# Patient Record
Sex: Male | Born: 1960
Health system: Southern US, Community
[De-identification: ages and names within clinical notes are randomized; demographics above are authoritative.]

## PROBLEM LIST (undated history)

## (undated) DIAGNOSIS — B359 Dermatophytosis, unspecified: Secondary | ICD-10-CM

## (undated) HISTORY — DX: Dermatophytosis, unspecified: B35.9

## (undated) HISTORY — PX: HERNIA REPAIR: SHX51

---

## 2011-11-02 ENCOUNTER — Other Ambulatory Visit: Payer: Self-pay | Admitting: Unknown Physician Specialty

## 2011-11-02 DIAGNOSIS — N5089 Other specified disorders of the male genital organs: Secondary | ICD-10-CM

## 2011-11-04 ENCOUNTER — Ambulatory Visit
Admission: RE | Admit: 2011-11-04 | Discharge: 2011-11-04 | Disposition: A | Discharge status: Home / Self Care | Payer: BC Managed Care – PPO | Source: Ambulatory Visit | Attending: Unknown Physician Specialty | Admitting: Unknown Physician Specialty

## 2011-11-04 DIAGNOSIS — N5089 Other specified disorders of the male genital organs: Secondary | ICD-10-CM

## 2012-11-03 IMAGING — US US SCROTUM
1 series · 13 of 25 positions shown · non-contrast
Comparison: None.

CLINICAL DATA: Palpable mass right testicle.

ULTRASOUND OF SCROTUM
TECHNIQUE: Complete ultrasound examination of the testicles,
epididymis, and other scrotal structures was performed.

[Series 1: us scrotum · 0.07mm/px · 74 acquisitions, 13 frames shown]
[im 1/74]
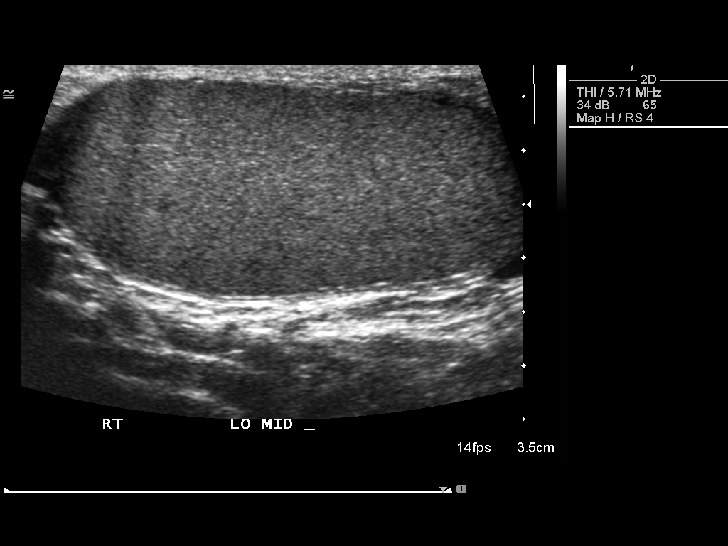
[im 7/74]
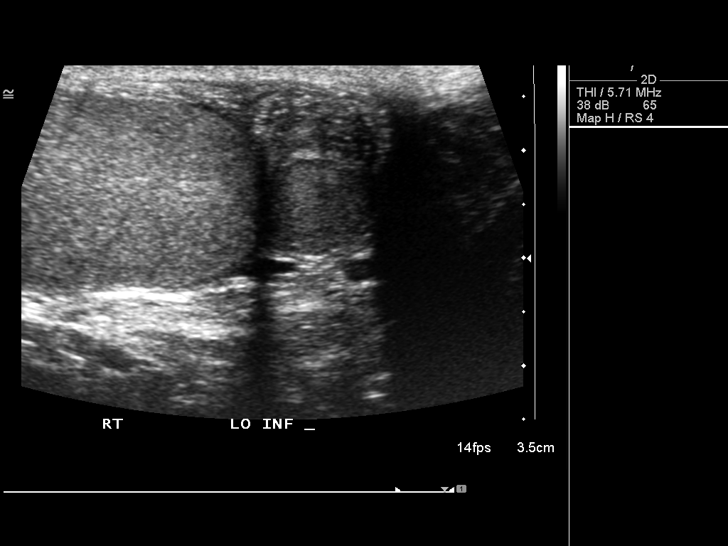
[im 13/74]
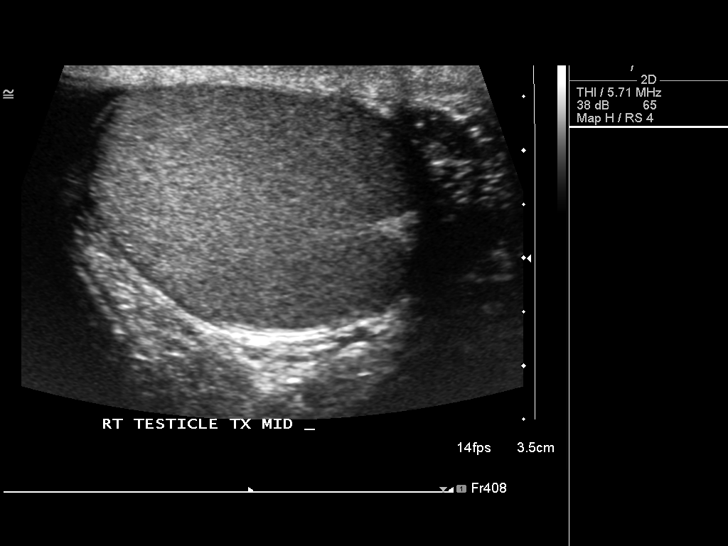
[im 19/74]
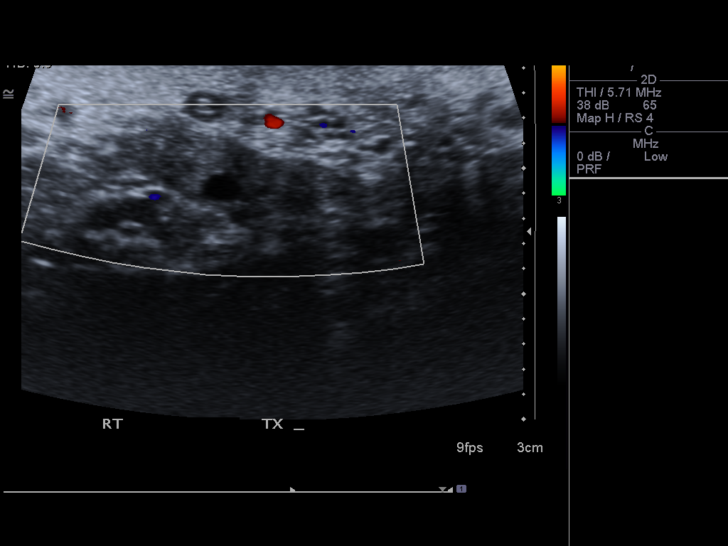
[im 25/74]
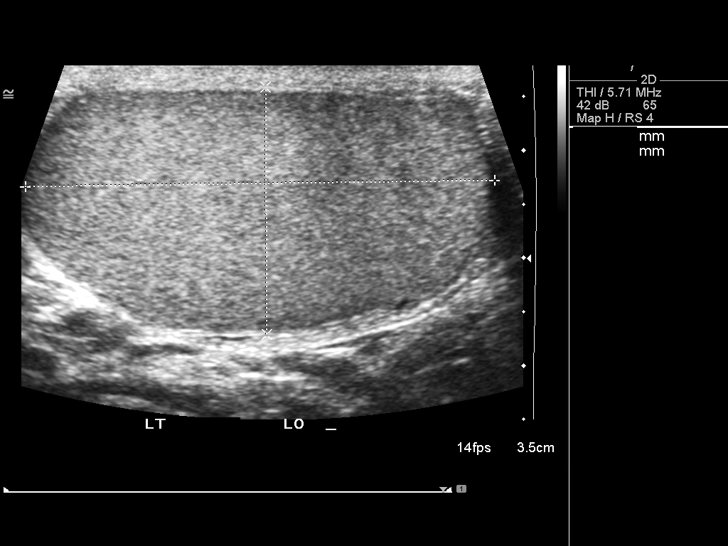
[im 31/74]
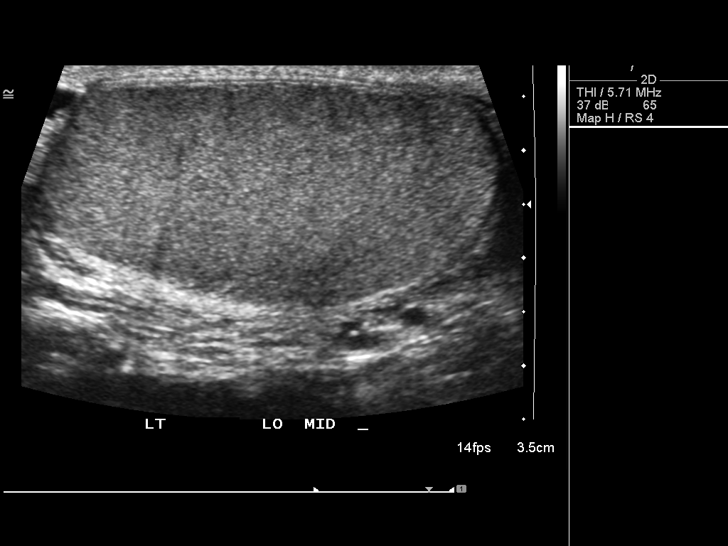
[im 37/74]
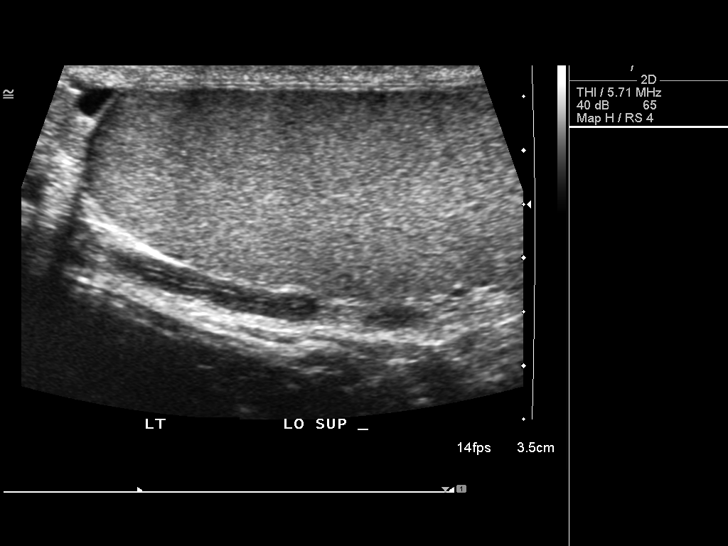
[im 43/74]
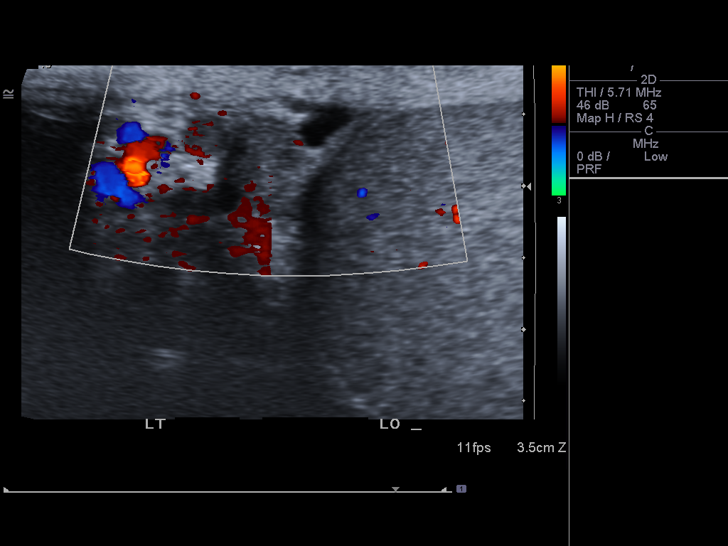
[im 49/74]
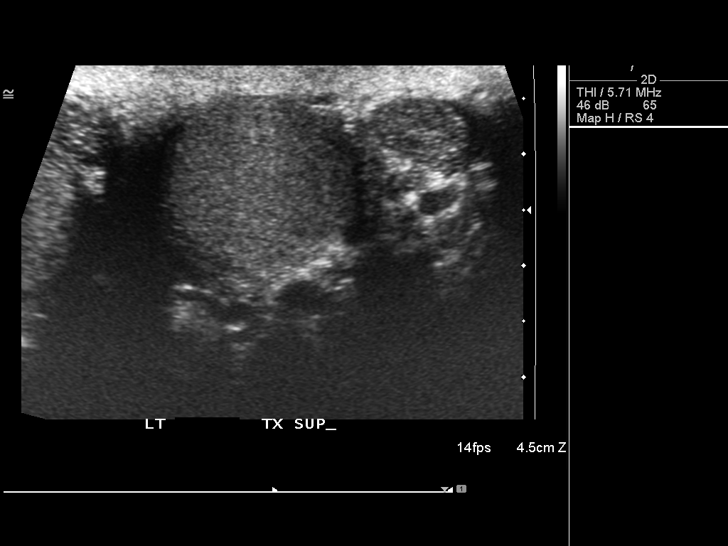
[im 55/74]
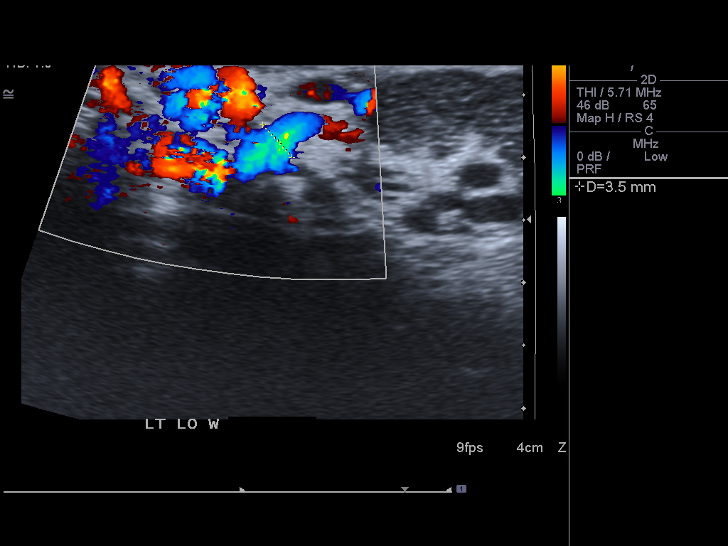
[im 61/74]
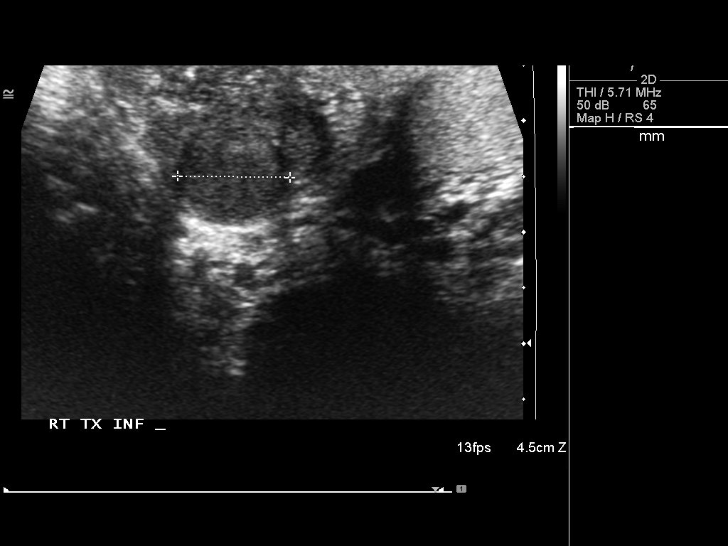
[im 67/74]
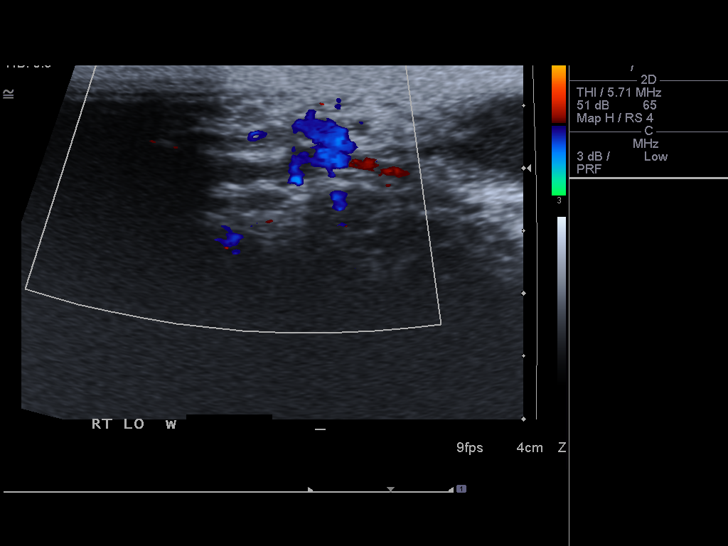
[im 74/74]
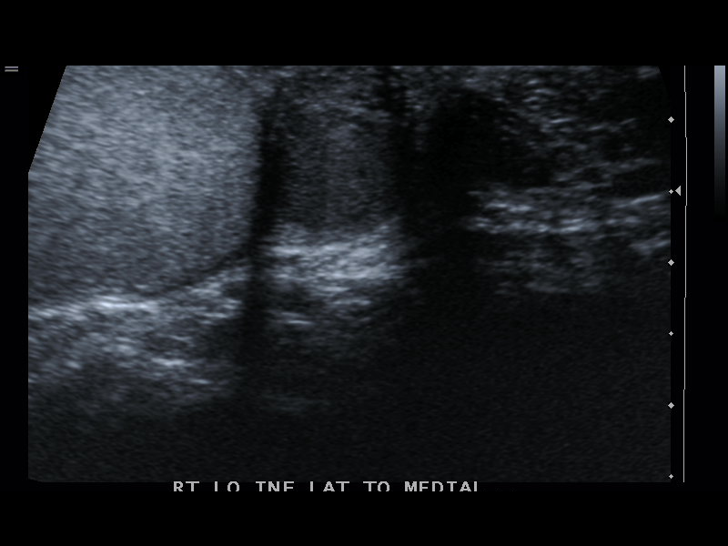

[13 of 25 positions shown; findings below may reference images not displayed]

FINDINGS: Right testis:  4.4 x 2.0 x 2.8 cm. Normal echotexture.  No focal
intratesticular abnormality.  Normal color Doppler flow.

Left testis:  4.4 x 2.3 x 3.1 cm.  Normal echotexture.  No focal
abnormality.  Normal color Doppler flow.

Right epididymis:  Small 4 mm cyst in the epididymal head.

Left epididymis:  Small 5 mm cyst in the epididymal head.

Hydrocele:  Absent.

Varicocele:  Small, left.

Inferior to the right testicle and adjacent to the epididymal tail
is a solid 1 cm hypoechoic nodule.  This is rounded and well-
circumscribed.  The nodule appears benign.  This may represent a
sperm granuloma if the patient has had prior vasectomy.
IMPRESSION: 1 cm well circumscribed rounded solid nodule inferior to the right
testicle (extratesticular) immediately adjacent to the epididymal
tail.  I favor this represents a sperm granuloma.  This can be
followed clinically and with repeat ultrasound in 4-6 months to
assure stability if felt clinically indicated.

No intratesticular abnormality.

Small bilateral epididymal cysts.

Small left varicocele.

## 2016-03-31 DIAGNOSIS — Z0000 Encounter for general adult medical examination without abnormal findings: Secondary | ICD-10-CM | POA: Diagnosis not present

## 2016-03-31 DIAGNOSIS — E7800 Pure hypercholesterolemia, unspecified: Secondary | ICD-10-CM | POA: Diagnosis not present

## 2016-07-13 DIAGNOSIS — J9801 Acute bronchospasm: Secondary | ICD-10-CM | POA: Diagnosis not present

## 2016-07-13 DIAGNOSIS — J069 Acute upper respiratory infection, unspecified: Secondary | ICD-10-CM | POA: Diagnosis not present

## 2016-08-05 DIAGNOSIS — Z1211 Encounter for screening for malignant neoplasm of colon: Secondary | ICD-10-CM | POA: Diagnosis not present

## 2016-08-05 DIAGNOSIS — D120 Benign neoplasm of cecum: Secondary | ICD-10-CM | POA: Diagnosis not present

## 2020-06-04 ENCOUNTER — Other Ambulatory Visit (HOSPITAL_BASED_OUTPATIENT_CLINIC_OR_DEPARTMENT_OTHER): Payer: Self-pay | Admitting: Internal Medicine

## 2020-06-04 ENCOUNTER — Ambulatory Visit: Payer: Self-pay | Attending: Internal Medicine

## 2020-06-04 DIAGNOSIS — Z23 Encounter for immunization: Secondary | ICD-10-CM

## 2020-06-04 NOTE — Progress Notes (Signed)
   Covid-19 Vaccination Clinic  Name:  Stephen Chen    MRN: [id number] DOB: 01-15-1961  06/04/2020  [name] was observed post Covid-19 immunization for 15 minutes without incident. He was provided with Vaccine Information Sheet and instruction to access the V-Safe system.   [name] was instructed to call 911 with any severe reactions post vaccine: Marland Kitchen Difficulty breathing  . Swelling of face and throat  . A fast heartbeat  . A bad rash all over body  . Dizziness and weakness   Immunizations Administered    Name Date Dose VIS Date Route   Pfizer COVID-19 Vaccine 06/04/2020 10:00 AM 0.3 mL 04/17/2020 Intramuscular   Manufacturer: [employer], [employer]   Lot: [id number]   NDC: [address]-1

## 2020-06-11 MED FILL — PFIZER-BIONTECH COVID-19 VA: 30 | 1 days supply | Qty: 0 | Fill #0

## 2021-01-14 ENCOUNTER — Ambulatory Visit: Payer: Self-pay | Attending: Internal Medicine

## 2021-01-14 DIAGNOSIS — Z23 Encounter for immunization: Secondary | ICD-10-CM

## 2021-01-14 NOTE — Progress Notes (Signed)
   Covid-19 Vaccination Clinic  Name:  Azavion Bouillon    MRN: [id number] DOB: June 16, 1961  01/14/2021  Mr. Townley was observed post Covid-19 immunization for 15 minutes without incident. He was provided with Vaccine Information Sheet and instruction to access the V-Safe system.   Mr. Delman was instructed to call 911 with any severe reactions post vaccine: Difficulty breathing  Swelling of face and throat  A fast heartbeat  A bad rash all over body  Dizziness and weakness   Immunizations Administered     Name Date Dose VIS Date Route   PFIZER Comrnaty(Gray TOP) Covid-19 Vaccine 01/14/2021 10:01 AM 0.3 mL 06/06/2020 Intramuscular   Manufacturer: [employer], [employer]   Lot: [id number]   NDC: [phone]

## 2021-01-20 ENCOUNTER — Other Ambulatory Visit (HOSPITAL_BASED_OUTPATIENT_CLINIC_OR_DEPARTMENT_OTHER): Payer: Self-pay

## 2021-01-20 MED ORDER — COVID-19 MRNA VAC-TRIS(PFIZER) 30 MCG/0.3ML IM SUSP
INTRAMUSCULAR | 0 refills | Status: DC
  Filled 2021-01-20: qty 0.3, 1d supply, fill #0

## 2021-09-18 ENCOUNTER — Telehealth: Payer: Self-pay | Admitting: Emergency Medicine

## 2021-09-18 DIAGNOSIS — R21 Rash and other nonspecific skin eruption: Secondary | ICD-10-CM

## 2021-09-18 MED ORDER — PREDNISONE 20 MG PO TABS: ORAL_TABLET | ORAL | 0 refills | Status: DC

## 2021-09-18 NOTE — Patient Instructions (Signed)
This seems more consistent with a contact dermatitis.  Let's treat it with steroids.  If you're not improving after a few days of the steroids, you should be seen in person. ? ?If your symptoms change, worsen, or you run a fever, you should be seen in person. ?

## 2021-09-18 NOTE — Progress Notes (Signed)
?Virtual Visit Consent  ? ?Stephen Chen, you are scheduled for a virtual visit with a [facility] provider today.   ?  ?Just as with appointments in the office, your consent must be obtained to participate.  Your consent will be active for this visit and any virtual visit you may have with one of our providers in the next 365 days.   ?  ?If you have a MyChart account, a copy of this consent can be sent to you electronically.  All virtual visits are billed to your insurance company just like a traditional visit in the office.   ? ?As this is a virtual visit, video technology does not allow for your provider to perform a traditional examination.  This may limit your provider's ability to fully assess your condition.  If your provider identifies any concerns that need to be evaluated in person or the need to arrange testing (such as labs, EKG, etc.), we will make arrangements to do so.   ?  ?Although advances in technology are sophisticated, we cannot ensure that it will always work on either your end or our end.  If the connection with a video visit is poor, the visit may have to be switched to a telephone visit.  With either a video or telephone visit, we are not always able to ensure that we have a secure connection.    ? ?I need to obtain your verbal consent now.   Are you willing to proceed with your visit today?  ?  ?[name] has provided verbal consent on 09/18/2021 for a virtual visit (video or telephone). ?  ?Roxy Horseman, PA-C  ? ?Date: 09/18/2021 11:17 AM ? ? ?Virtual Visit via Video Note  ? Stephen Chen, connected with  Stephen Chen  ([id number], 1960/10/10) on 09/18/21 at 11:15 AM EDT by a video-enabled telemedicine application and verified that I am speaking with the correct person using two identifiers. ? ?Location: ?Patient: Virtual Visit Location Patient: Home ?Provider: Virtual Visit Location Provider: Home Office ?  ?I discussed the limitations of evaluation and management by telemedicine and  the availability of in person appointments. The patient expressed understanding and agreed to proceed.   ? ?History of Present Illness: ?Stephen Chen is a 61 y.o. who identifies as a male who was assigned male at birth, and is being seen today for rash.  States that he thinks he might have shingles or poison ivy.  States that he has been in some brush outside.  Reports having had COVID a few months ago.  States that he had chicken pox as a kid.  Denies feeling sick.  Denies other medical problems.  States that he has had the rash for a few weeks now. ? ?HPI: HPI  ?Problems: There are no problems to display for this patient. ?  ?Allergies: Not on File ?Medications:  ?Current Outpatient Medications:  ?  COVID-19 mRNA Vac-TriS, Pfizer, SUSP injection, Inject into the muscle., Disp: 0.3 mL, Rfl: 0 ? ?Observations/Objective: ?Patient is well-developed, well-nourished in no acute distress.  ?Resting comfortably at home.  ?Head is normocephalic, atraumatic.  ?No labored breathing.  ?Speech is clear and coherent with logical content.  ?Patient is alert and oriented at baseline.  ?Scattered vesicular lesions on arms and legs ? ?Assessment and Plan: ?1. Rash ? ?Prednisone for presumed contact dermatitis.  Discussed with patient that shingles is unlikely given bilateral presentation in healthy male and length of illness. ? ?Follow Up Instructions: ?I discussed the assessment and  treatment plan with the patient. The patient was provided an opportunity to ask questions and all were answered. The patient agreed with the plan and demonstrated an understanding of the instructions.  A copy of instructions were sent to the patient via MyChart unless otherwise noted below.  ? ? ?The patient was advised to call back or seek an in-person evaluation if the symptoms worsen or if the condition fails to improve as anticipated. ? ?Time:  ?I spent 12 minutes with the patient via telehealth technology discussing the above problems/concerns.    ? ?Roxy Horseman, PA-C ? ?

## 2022-05-18 ENCOUNTER — Telehealth: Payer: Self-pay | Admitting: Physician Assistant

## 2022-05-18 DIAGNOSIS — L2084 Intrinsic (allergic) eczema: Secondary | ICD-10-CM

## 2022-05-18 MED ORDER — TRIAMCINOLONE ACETONIDE 0.1 % EX CREA: 1.0000 | TOPICAL_CREAM | Freq: Two times a day (BID) | CUTANEOUS | 0 refills | Status: DC

## 2022-05-18 MED ORDER — PREDNISONE 20 MG PO TABS: ORAL_TABLET | ORAL | 0 refills | Status: DC

## 2022-05-18 NOTE — Progress Notes (Signed)
Virtual Visit Consent   Stephen Chen, you are scheduled for a virtual visit with a [facility] provider today. Just as with appointments in the office, your consent must be obtained to participate. Your consent will be active for this visit and any virtual visit you may have with one of our providers in the next 365 days. If you have a MyChart account, a copy of this consent can be sent to you electronically.  As this is a virtual visit, video technology does not allow for your provider to perform a traditional examination. This may limit your provider's ability to fully assess your condition. If your provider identifies any concerns that need to be evaluated in person or the need to arrange testing (such as labs, EKG, etc.), we will make arrangements to do so. Although advances in technology are sophisticated, we cannot ensure that it will always work on either your end or our end. If the connection with a video visit is poor, the visit may have to be switched to a telephone visit. With either a video or telephone visit, we are not always able to ensure that we have a secure connection.  By engaging in this virtual visit, you consent to the provision of healthcare and authorize for your insurance to be billed (if applicable) for the services provided during this visit. Depending on your insurance coverage, you may receive a charge related to this service.  I need to obtain your verbal consent now. Are you willing to proceed with your visit today? Aime Basnett has provided verbal consent on 05/18/2022 for a virtual visit (video or telephone). [name], PA-C  Date: 05/18/2022 9:18 AM  Virtual Visit via Video Note   I, [name], connected with  Stephen Chen  ([id number], 03-10-1961) on 05/18/22 at  9:00 AM EST by a video-enabled telemedicine application and verified that I am speaking with the correct person using two identifiers.  Location: Patient: Virtual Visit Location Patient:  Home Provider: Virtual Visit Location Provider: Home Office   I discussed the limitations of evaluation and management by telemedicine and the availability of in person appointments. The patient expressed understanding and agreed to proceed.    History of Present Illness: Stephen Chen is a 61 y.o. who identifies as a male who was assigned male at birth, and is being seen today for rash.  HPI: Rash This is a new problem. The current episode started more than 1 month ago (had a flare in March and recent). The problem has been gradually worsening since onset. The affected locations include the torso, left arm, right arm and left lower leg. The rash is characterized by itchiness, scaling and redness. He was exposed to nothing. Pertinent negatives include no congestion, cough, fatigue, rhinorrhea or shortness of breath. Past treatments include oral steroids. The treatment provided no relief.     Problems: There are no problems to display for this patient.   Allergies: Not on File Medications:  Current Outpatient Medications:    triamcinolone cream (KENALOG) 0.1 %, Apply 1 Application topically 2 (two) times daily., Disp: 80 g, Rfl: 0   COVID-19 mRNA Vac-TriS, Pfizer, SUSP injection, Inject into the muscle., Disp: 0.3 mL, Rfl: 0   predniSONE (DELTASONE) 20 MG tablet, Take 40 mg by mouth daily for 5 days, then 20mg  by mouth daily for 5 days, then 10mg  daily for 5 days, Disp: 18 tablet, Rfl: 0  Observations/Objective: Patient is well-developed, well-nourished in no acute distress.  Resting comfortably at home.  Head is normocephalic, atraumatic.  No labored breathing.  Speech is clear and coherent with logical content.  Patient is alert and oriented at baseline.  Erythematous patches with well demarcated, irregular borders that began with some vesicles and papules progressing to flat patch with scaling that is pruritic  Assessment and Plan: 1. Intrinsic eczema - predniSONE (DELTASONE) 20 MG  tablet; Take 40 mg by mouth daily for 5 days, then 20mg  by mouth daily for 5 days, then 10mg  daily for 5 days  Dispense: 18 tablet; Refill: 0 - triamcinolone cream (KENALOG) 0.1 %; Apply 1 Application topically 2 (two) times daily.  Dispense: 80 g; Refill: 0  - Suspect eczema based off lesions visualized but advised with video may not be fully able to evaluate clearly - Second occurrence, last was March - Will treat with prednisone since been present over a month again - Triamcinolone for new flares in the future - Luke warm to cool showers to avoid activating itch reflex - Moisturize well (Lubriderm, Eucerin, CeraVe, Aquaphor) - Seek in person evaluation if symptoms continue to worsen or fail to improve with treatment  Follow Up Instructions: I discussed the assessment and treatment plan with the patient. The patient was provided an opportunity to ask questions and all were answered. The patient agreed with the plan and demonstrated an understanding of the instructions.  A copy of instructions were sent to the patient via MyChart unless otherwise noted below.    The patient was advised to call back or seek an in-person evaluation if the symptoms worsen or if the condition fails to improve as anticipated.  Time:  I spent 15 minutes with the patient via telehealth technology discussing the above problems/concerns.    , PA-C

## 2022-05-18 NOTE — Patient Instructions (Signed)
Stephen Chen, thank you for joining [name], PA-C for today's virtual visit.  While this provider is not your primary care provider (PCP), if your PCP is located in our provider database this encounter information will be shared with them immediately following your visit.   A [facility] MyChart account gives you access to today's visit and all your visits, tests, and labs performed at [facility] " click here if you don't have a [facility] MyChart account or go to [email or web address]  Consent: (Patient) Stephen Chen provided verbal consent for this virtual visit at the beginning of the encounter.  Current Medications:  Current Outpatient Medications:    triamcinolone cream (KENALOG) 0.1 %, Apply 1 Application topically 2 (two) times daily., Disp: 80 g, Rfl: 0   COVID-19 mRNA Vac-TriS, Pfizer, SUSP injection, Inject into the muscle., Disp: 0.3 mL, Rfl: 0   predniSONE (DELTASONE) 20 MG tablet, Take 40 mg by mouth daily for 5 days, then 20mg  by mouth daily for 5 days, then 10mg  daily for 5 days, Disp: 18 tablet, Rfl: 0   Medications ordered in this encounter:  Meds ordered this encounter  Medications   predniSONE (DELTASONE) 20 MG tablet    Sig: Take 40 mg by mouth daily for 5 days, then 20mg  by mouth daily for 5 days, then 10mg  daily for 5 days    Dispense:  18 tablet    Refill:  0    Order Specific Question:   Supervising Provider    Answer:      triamcinolone cream (KENALOG) 0.1 %    Sig: Apply 1 Application topically 2 (two) times daily.    Dispense:  80 g    Refill:  0    Order Specific Question:   Supervising Provider    Answer:        *If you need refills on other medications prior to your next appointment, please contact your pharmacy*  Follow-Up: Call back or seek an in-person evaluation if the symptoms worsen or if the condition fails to improve as anticipated.  [facility]  [phone]  Other Instructions  Eczema Eczema refers to a group of skin conditions that cause skin to become rough and inflamed. Each type of eczema has different triggers, symptoms, and treatments. Eczema of any type is usually itchy. Symptoms range from mild to severe. Eczema is not spread from person to person (is not contagious). It can appear on different parts of the body at different times. One person's eczema may look different from another person's eczema. What are the causes? The exact cause of this condition is not known. However, exposure to certain environmental factors, irritants, and allergens can make the condition worse. What are the signs or symptoms? Symptoms of this condition depend on the type of eczema you have. The types include: Contact dermatitis. There are two kinds: Irritant contact dermatitis. This happens when something irritates the skin and causes a rash. Allergic contact dermatitis. This happens when your skin comes in contact with something you are allergic to (allergens). This can include poison ivy, chemicals, or medicines that were applied to your skin. Atopic dermatitis. This is a long-term (chronic) skin disease that keeps coming back (recurring). It is the most common type of eczema. Usual symptoms are a red rash and itchy, dry, scaly skin. It usually starts showing signs in infancy and can last through adulthood. Dyshidrotic eczema. This is a form of eczema on the hands  and feet. It shows up as very itchy, fluid-filled blisters. It can affect people of any age but is more common before age 43. Hand eczema. This causes very itchy areas of skin on the palms and sides of the hands and fingers. This type of eczema is common in industrial jobs where you may be exposed to different types of irritants. Lichen simplex chronicus. This type of eczema occurs when a person constantly scratches one area of the body. Repeated scratching of the area leads to thickened  skin (lichenification). This condition can accompany other types of eczema. It is more common in adults but may also be seen in children. Nummular eczema. This is a common type of eczema that most often affects the lower legs and the backs of the hands. It typically causes an itchy, red, circular, crusty lesion (plaque). Scratching may become a habit and can cause bleeding. Nummular eczema occurs most often in middle-aged or older people. Seborrheic dermatitis. This is a common skin disease that mainly affects the scalp. It may also affect other oily areas of the body, such as the face, sides of the nose, eyebrows, ears, eyelids, and chest. It is marked by small scaling and redness of the skin (erythema). This can affect people of all ages. In infants, this condition is called cradle cap. Stasis dermatitis. This is a common skin disease that can cause itching, scaling, and hyperpigmentation, usually on the legs and feet. It occurs most often in people who have a condition that prevents blood from being pumped through the veins in the legs (chronic venous insufficiency). Stasis dermatitis is a chronic condition that needs long-term management. How is this diagnosed? This condition may be diagnosed based on: A physical exam of your skin. Your medical history. Skin patch tests. These tests involve using patches that contain possible allergens and placing them on your back. Your health care provider will check in a few days to see if an allergic reaction occurred. How is this treated? Treatment for eczema is based on the type of eczema you have. You may be given hydrocortisone steroid medicine or antihistamines. These can relieve itching quickly and help reduce inflammation. These may be prescribed or purchased over the counter, depending on the strength that is needed. Follow these instructions at home: Take or apply over-the-counter and prescription medicines only as told by your health care provider. Use  creams or ointments to moisturize your skin. Do not use lotions. Learn what triggers or irritates your symptoms so you can avoid these things. Treat symptom flare-ups quickly. Do not scratch your skin. This can make your rash worse. Keep all follow-up visits. This is important. Where to find more information American Academy of Dermatology: MarketingSheets.si National Eczema Association: nationaleczema.org The Society for Pediatric Dermatology: pedsderm.net Contact a health care provider if: You have severe itching, even with treatment. You scratch your skin regularly until it bleeds. Your rash looks different than usual. Your skin is painful, swollen, or more red than usual. You have a fever. Summary Eczema refers to a group of skin conditions that cause skin to become rough and inflamed. Each type has different triggers. Eczema of any type causes itching that may range from mild to severe. Treatment varies based on the type of eczema you have. Hydrocortisone steroid medicine or antihistamines can help with itching and inflammation. Protecting your skin is the best way to prevent eczema. Use creams or ointments to moisturize your skin. Avoid triggers and irritants. Treat flare-ups quickly. This information is  not intended to replace advice given to you by your health care provider. Make sure you discuss any questions you have with your health care provider. Document Revised: 03/25/2020 Document Reviewed: 03/25/2020 Elsevier Patient Education  2023 Elsevier Inc.    If you have been instructed to have an in-person evaluation today at a local Urgent Care facility, please use the link below. It will take you to a list of all of our available [facility], including address, phone number and hours of operation. Please do not delay care.  [facility]  If you or a family member do not have a primary care provider, use the link below to schedule a visit and establish care. When  you choose a [facility] primary care physician or advanced practice provider, you gain a long-term partner in health. Find a Primary Care Provider  Learn more about [facility]'s in-office and virtual care options: [facility] - Get Care Now

## 2022-10-13 ENCOUNTER — Telehealth: Payer: Self-pay | Admitting: Family Medicine

## 2022-10-13 DIAGNOSIS — B354 Tinea corporis: Secondary | ICD-10-CM

## 2022-10-13 MED ORDER — FLUCONAZOLE 150 MG PO TABS: 150.0000 mg | ORAL_TABLET | ORAL | 0 refills | Status: AC

## 2022-10-13 MED ORDER — TRIAMCINOLONE ACETONIDE 0.1 % EX CREA
1.0000 | TOPICAL_CREAM | Freq: Two times a day (BID) | CUTANEOUS | 0 refills | Status: DC
Start: 2022-10-13 — End: 2023-05-05

## 2022-10-13 NOTE — Progress Notes (Unsigned)
Virtual Visit Consent   Stephen Chen, you are scheduled for a virtual visit with a [facility] provider today. Just as with appointments in the office, your consent must be obtained to participate. Your consent will be active for this visit and any virtual visit you may have with one of our providers in the next 365 days. If you have a MyChart account, a copy of this consent can be sent to you electronically.  As this is a virtual visit, video technology does not allow for your provider to perform a traditional examination. This may limit your provider's ability to fully assess your condition. If your provider identifies any concerns that need to be evaluated in person or the need to arrange testing (such as labs, EKG, etc.), we will make arrangements to do so. Although advances in technology are sophisticated, we cannot ensure that it will always work on either your end or our end. If the connection with a video visit is poor, the visit may have to be switched to a telephone visit. With either a video or telephone visit, we are not always able to ensure that we have a secure connection.  By engaging in this virtual visit, you consent to the provision of healthcare and authorize for your insurance to be billed (if applicable) for the services provided during this visit. Depending on your insurance coverage, you may receive a charge related to this service.  I need to obtain your verbal consent now. Are you willing to proceed with your visit today? [name] has provided verbal consent on 10/13/2022 for a virtual visit (video or telephone). Freddy Finner, NP  Date: 10/13/2022 10:02 AM  Virtual Visit via Video Note   I, Freddy Finner, connected with  [name]  ([id number], [birth year]) on 10/13/22 at 10:00 AM EDT by a video-enabled telemedicine application and verified that I am speaking with the correct person using two identifiers.  Location: Patient: Virtual Visit Location Patient: Home Provider:  Virtual Visit Location Provider: Home Office   I discussed the limitations of evaluation and management by telemedicine and the availability of in person appointments. The patient expressed understanding and agreed to proceed.    History of Present Illness: Stephen Chen is a 62 y.o. who identifies as a male who was assigned male at birth, and is being seen today for rash.  Recurrent rash/ eczema Dx in 04/2022- see notes in EPIC Never had this prior to last Fall when had this occur  Flare up now- affected locations include the left elbow, right elbow, top of shoulders, back and shins. Itching and red, scaling- and circle   Has tried antifungal cream without much success. Tried for a month- it helped the itching. Seemed to improve, but worsen again. Does not free prednisone was as much of a benefit when he took it last Fall.   Problems: There are no problems to display for this patient.   Allergies: Not on File Medications:  Current Outpatient Medications:    COVID-19 mRNA Vac-TriS, Pfizer, SUSP injection, Inject into the muscle., Disp: 0.3 mL, Rfl: 0   predniSONE (DELTASONE) 20 MG tablet, Take 40 mg by mouth daily for 5 days, then  by mouth daily for 5 days, then  daily for 5 days, Disp: 18 tablet, Rfl: 0   triamcinolone cream (KENALOG) 0.1 %, Apply 1 Application topically 2 (two) times daily., Disp: 80 g, Rfl: 0  Observations/Objective:  No labored breathing.  Speech is clear and coherent with logical content.  Patient is alert and oriented at baseline.    Assessment and Plan:   1. Tinea corporis  - fluconazole (DIFLUCAN) 150 MG tablet; Take 1 tablet (150 mg total) by mouth once a week for 4 doses.  Dispense: 4 tablet; Refill: 0 - triamcinolone cream (KENALOG) 0.1 %; Apply 1 Application topically 2 (two) times daily.  Dispense: 80 g; Refill: 0  -based off conversation and review of notes - will try fungal oral treatment this time around.  -complete 4 weeks; follow up  if not improved -use cream for immediate relief of itching and spread    Reviewed side effects, risks and benefits of medication.    Patient acknowledged agreement and understanding of the plan.   Past Medical, Surgical, Social History, Allergies, and Medications have been Reviewed.    Follow Up Instructions: I discussed the assessment and treatment plan with the patient. The patient was provided an opportunity to ask questions and all were answered. The patient agreed with the plan and demonstrated an understanding of the instructions.  A copy of instructions were sent to the patient via MyChart unless otherwise noted below.    The patient was advised to call back or seek an in-person evaluation if the symptoms worsen or if the condition fails to improve as anticipated.  Time:  I spent 10 minutes with the patient via telehealth technology discussing the above problems/concerns.    Freddy Finner, NP

## 2022-10-13 NOTE — Patient Instructions (Addendum)
Stephen Chen, thank you for joining Freddy Finner, NP for today's virtual visit.  While this provider is not your primary care provider (PCP), if your PCP is located in our provider database this encounter information will be shared with them immediately following your visit.   A [facility] MyChart account gives you access to today's visit and all your visits, tests, and labs performed at [facility] " click here if you don't have a [facility] MyChart account or go to [email or web address]  Consent: (Patient) Stephen Chen provided verbal consent for this virtual visit at the beginning of the encounter.  Current Medications:  Current Outpatient Medications:    fluconazole (DIFLUCAN) 150 MG tablet, Take 1 tablet (150 mg total) by mouth once a week for 4 doses., Disp: 4 tablet, Rfl: 0   COVID-19 mRNA Vac-TriS, Pfizer, SUSP injection, Inject into the muscle., Disp: 0.3 mL, Rfl: 0   predniSONE (DELTASONE) 20 MG tablet, Take 40 mg by mouth daily for 5 days, then  by mouth daily for 5 days, then  daily for 5 days, Disp: 18 tablet, Rfl: 0   triamcinolone cream (KENALOG) 0.1 %, Apply 1 Application topically 2 (two) times daily., Disp: 80 g, Rfl: 0   Medications ordered in this encounter:  Meds ordered this encounter  Medications   fluconazole (DIFLUCAN) 150 MG tablet    Sig: Take 1 tablet (150 mg total) by mouth once a week for 4 doses.    Dispense:  4 tablet    Refill:  0    Order Specific Question:   Supervising Provider    Answer:   Merrilee Jansky [id number]   triamcinolone cream (KENALOG) 0.1 %    Sig: Apply 1 Application topically 2 (two) times daily.    Dispense:  80 g    Refill:  0    Order Specific Question:   Supervising Provider    Answer:   Merrilee Jansky [[id number]]     *If you need refills on other medications prior to your next appointment, please contact your pharmacy*  Follow-Up: Call back or seek an in-person evaluation if the symptoms worsen or  if the condition fails to improve as anticipated.  [facility] [phone]  Other Instructions  Body Ringworm Body ringworm is an infection of the skin that often causes a ring-shaped rash. Body ringworm is also called tinea corporis. Body ringworm can affect any part of your skin. This condition is easily spread from person to person (is very contagious). What are the causes? This condition is caused by fungi called dermatophytes. The condition develops when these fungi grow out of control on the skin. You can get this condition if you touch a person or animal that has it. You can also get it if you share any items with an infected person or pet. These include: Clothing, bedding, and towels. Brushes or combs. Gym equipment. Any other object that has the fungus on it. What increases the risk? You are more likely to develop this condition if you: Play sports that involve close physical contact, such as wrestling. Sweat a lot. Live in areas that are hot and humid. Use public showers. Have a weakened disease-fighting system (immune system). What are the signs or symptoms? Symptoms of this condition include: Itchy, raised red spots and bumps. Red scaly patches. A ring-shaped rash. The rash may have: A clear center. Scales or red bumps at its center. Redness near its borders. Dry and scaly skin on or  around it. How is this diagnosed? This condition can usually be diagnosed with a skin exam. A skin scraping may be taken from the affected area and examined under a microscope to see if the fungus is present. How is this treated? This condition may be treated with: An antifungal cream or ointment. An antifungal shampoo. Antifungal medicines. These may be prescribed if your ringworm: Is severe. Keeps coming back or lasts a long time. Follow these instructions at home: Take over-the-counter and prescription medicines only as told by your health care provider. If you  were given an antifungal cream or ointment: Use it as told by your health care provider. Wash the infected area and dry it completely before applying the cream or ointment. If you were given an antifungal shampoo: Use it as told by your health care provider. Leave the shampoo on your body for 3-5 minutes before rinsing. While you have a rash: Wear loose clothing to stop clothes from rubbing and irritating it. Wash or change your bed sheets every night. Wash clothes and bed sheets in hot water. Disinfect or throw out items that may be infected. Wash your hands often with soap and water for at least 20 seconds. If soap and water are not available, use hand sanitizer. If your pet has the same infection, take your pet to see a veterinarian for treatment. How is this prevented? Take a bath or shower every day and after every time you work out or play sports. Dry your skin completely after bathing. Wear sandals or shoes in public places and showers. Wash athletic clothes after each use. Do not share personal items with others. Avoid touching red patches of skin on other people. Avoid touching pets that have bald spots. If you touch an animal that has a bald spot, wash your hands. Contact a health care provider if: Your rash continues to spread after 7 days of treatment. Your rash is not gone in 4 weeks. The area around your rash gets red, warm, tender, and swollen. This information is not intended to replace advice given to you by your health care provider. Make sure you discuss any questions you have with your health care provider. Document Revised: 11/27/2021 Document Reviewed: 11/27/2021 Elsevier Patient Education  2023 Elsevier Inc.    If you have been instructed to have an in-person evaluation today at a local Urgent Care facility, please use the link below. It will take you to a list of all of our available [facility], including address, phone number and hours of  operation. Please do not delay care.  [facility]  If you or a family member do not have a primary care provider, use the link below to schedule a visit and establish care. When you choose a [facility] primary care physician or advanced practice provider, you gain a long-term partner in health. Find a Primary Care Provider  Learn more about [facility]'s in-office and virtual care options: [facility] - Get Care Now

## 2022-11-10 ENCOUNTER — Telehealth: Payer: Self-pay | Admitting: Family Medicine

## 2022-11-10 DIAGNOSIS — B354 Tinea corporis: Secondary | ICD-10-CM

## 2022-11-10 DIAGNOSIS — R21 Rash and other nonspecific skin eruption: Secondary | ICD-10-CM

## 2022-11-10 MED ORDER — TERBINAFINE HCL 250 MG PO TABS
250.0000 mg | ORAL_TABLET | Freq: Every day | ORAL | 0 refills | Status: AC
Start: 2022-11-10 — End: 2022-11-24

## 2022-11-10 MED ORDER — NYSTATIN-TRIAMCINOLONE 100000-0.1 UNIT/GM-% EX OINT
1.0000 | TOPICAL_OINTMENT | Freq: Two times a day (BID) | CUTANEOUS | 0 refills | Status: DC
Start: 2022-11-10 — End: 2023-08-10

## 2022-11-10 NOTE — Patient Instructions (Signed)
  Stephen Chen, thank you for joining Freddy Finner, NP for today's virtual visit.  While this provider is not your primary care provider (PCP), if your PCP is located in our provider database this encounter information will be shared with them immediately following your visit.   A [facility] MyChart account gives you access to today's visit and all your visits, tests, and labs performed at [facility] " click here if you don't have a [facility] MyChart account or go to [email or web address]  Consent: (Patient) Stephen Chen provided verbal consent for this virtual visit at the beginning of the encounter.  Current Medications:  Current Outpatient Medications:    nystatin-triamcinolone ointment (MYCOLOG), Apply 1 Application topically 2 (two) times daily., Disp: 30 g, Rfl: 0   terbinafine (LAMISIL) 250 MG tablet, Take 1 tablet (250 mg total) by mouth daily for 14 days., Disp: 14 tablet, Rfl: 0   COVID-19 mRNA Vac-TriS, Pfizer, SUSP injection, Inject into the muscle., Disp: 0.3 mL, Rfl: 0   predniSONE (DELTASONE) 20 MG tablet, Take 40 mg by mouth daily for 5 days, then 20mg  by mouth daily for 5 days, then 10mg  daily for 5 days, Disp: 18 tablet, Rfl: 0   triamcinolone cream (KENALOG) 0.1 %, Apply 1 Application topically 2 (two) times daily., Disp: 80 g, Rfl: 0   Medications ordered in this encounter:  Meds ordered this encounter  Medications   terbinafine (LAMISIL) 250 MG tablet    Sig: Take 1 tablet (250 mg total) by mouth daily for 14 days.    Dispense:  14 tablet    Refill:  0    Order Specific Question:   Supervising Provider    Answer:   Merrilee Jansky [[id number]]   nystatin-triamcinolone ointment (MYCOLOG)    Sig: Apply 1 Application topically 2 (two) times daily.    Dispense:  30 g    Refill:  0    Order Specific Question:   Supervising Provider    Answer:   Merrilee Jansky [id number]     *If you need refills on other medications prior to your next appointment,  please contact your pharmacy*  Follow-Up: Call back or seek an in-person evaluation if the symptoms worsen or if the condition fails to improve as anticipated.  [facility] [phone]  Other Instructions  Use meds as directed  Hope this clears it up!  If not- please be seen in person to see what might been to be done.   If you have been instructed to have an in-person evaluation today at a local Urgent Care facility, please use the link below. It will take you to a list of all of our available [facility], including address, phone number and hours of operation. Please do not delay care.  [facility]  If you or a family member do not have a primary care provider, use the link below to schedule a visit and establish care. When you choose a [facility] primary care physician or advanced practice provider, you gain a long-term partner in health. Find a Primary Care Provider  Learn more about [facility]'s in-office and virtual care options: [facility] - Get Care Now

## 2022-11-10 NOTE — Progress Notes (Signed)
[place]   Unable to connect camera to see rash.  Will try to be seen in person or work on getting a camera working.

## 2022-11-10 NOTE — Progress Notes (Signed)
Virtual Visit Consent   Stephen Chen, you are scheduled for a virtual visit with a [facility] provider today. Just as with appointments in the office, your consent must be obtained to participate. Your consent will be active for this visit and any virtual visit you may have with one of our providers in the next 365 days. If you have a MyChart account, a copy of this consent can be sent to you electronically.  As this is a virtual visit, video technology does not allow for your provider to perform a traditional examination. This may limit your provider's ability to fully assess your condition. If your provider identifies any concerns that need to be evaluated in person or the need to arrange testing (such as labs, EKG, etc.), we will make arrangements to do so. Although advances in technology are sophisticated, we cannot ensure that it will always work on either your end or our end. If the connection with a video visit is poor, the visit may have to be switched to a telephone visit. With either a video or telephone visit, we are not always able to ensure that we have a secure connection.  By engaging in this virtual visit, you consent to the provision of healthcare and authorize for your insurance to be billed (if applicable) for the services provided during this visit. Depending on your insurance coverage, you may receive a charge related to this service.  I need to obtain your verbal consent now. Are you willing to proceed with your visit today? Stephen Chen has provided verbal consent on 11/10/2022 for a virtual visit (video or telephone). Stephen Finner, NP  Date: 11/10/2022 12:26 PM  Virtual Visit via Video Note   I, Stephen Chen, connected with  Stephen Chen  ([id number], 10-26-1960) on 11/10/22 at 12:15 PM EDT by a video-enabled telemedicine application and verified that I am speaking with the correct person using two identifiers.  Location: Patient: Virtual Visit Location Patient: Home Provider:  Virtual Visit Location Provider: Home Office   I discussed the limitations of evaluation and management by telemedicine and the availability of in person appointments. The patient expressed understanding and agreed to proceed.    History of Present Illness: Stephen Chen is a 62 y.o. who identifies as a male who was assigned male at birth, and is being seen today for improved but on going rash.   Recurrent rash/ eczema Dx in 04/2022- see notes in EPIC Never had this prior to last Fall when had this occur  Treated last month for fungal infection- reports improvement in rash- with reduction in size of areas, but still having some present. Has finished his Diflucan and following up since it has not fully resolved.   Was having a flare up in April- affected locations include the left elbow, right elbow, top of shoulders, back and shins. Itching and red, scaling- and circle    Has tried antifungal cream OTC without much success   Tried for a month- it helped the itching. Seemed to improve, but worsen again. Does not feel prednisone was as much of a benefit when he took it last Fall.  Kenalog is helping itching some.     Problems: There are no problems to display for this patient.   Allergies: Not on File Medications:  Current Outpatient Medications:    nystatin-triamcinolone ointment (MYCOLOG), Apply 1 Application topically 2 (two) times daily., Disp: 30 g, Rfl: 0   terbinafine (LAMISIL) 250 MG tablet, Take 1 tablet (250 mg  total) by mouth daily for 14 days., Disp: 14 tablet, Rfl: 0   COVID-19 mRNA Vac-TriS, Pfizer, SUSP injection, Inject into the muscle., Disp: 0.3 mL, Rfl: 0   predniSONE (DELTASONE) 20 MG tablet, Take 40 mg by mouth daily for 5 days, then 20mg  by mouth daily for 5 days, then 10mg  daily for 5 days, Disp: 18 tablet, Rfl: 0   triamcinolone cream (KENALOG) 0.1 %, Apply 1 Application topically 2 (two) times daily., Disp: 80 g, Rfl: 0  Observations/Objective: Patient is  well-developed, well-nourished in no acute distress.  Resting comfortably  at home.  Head is normocephalic, atraumatic.  No labored breathing.  Speech is clear and coherent with logical content.  Patient is alert and oriented at baseline.  Rash areas noted to have reduced size- across upper shoulder and back and arms.  Assessment and Plan: 1. Tinea corporis - terbinafine (LAMISIL) 250 MG tablet; Take 1 tablet (250 mg total) by mouth daily for 14 days.  Dispense: 14 tablet; Refill: 0 - nystatin-triamcinolone ointment (MYCOLOG); Apply 1 Application topically 2 (two) times daily.  Dispense: 30 g; Refill: 0  -due to improvement but no resolution- will switch to lamisil daily for 7-14 days -and mycolog as well -still questionable etiology- and might benefit from an in person eval if this does not resolve rash fully.  Reviewed side effects, risks and benefits of medication.    Patient acknowledged agreement and understanding of the plan.   Past Medical, Surgical, Social History, Allergies, and Medications have been Reviewed.    Follow Up Instructions: I discussed the assessment and treatment plan with the patient. The patient was provided an opportunity to ask questions and all were answered. The patient agreed with the plan and demonstrated an understanding of the instructions.  A copy of instructions were sent to the patient via MyChart unless otherwise noted below.    The patient was advised to call back or seek an in-person evaluation if the symptoms worsen or if the condition fails to improve as anticipated.  Time:  I spent 10 minutes with the patient via telehealth technology discussing the above problems/concerns.    Stephen Finner, NP

## 2022-11-11 ENCOUNTER — Ambulatory Visit: Payer: Self-pay

## 2023-05-04 ENCOUNTER — Telehealth: Payer: Self-pay | Admitting: Family

## 2023-05-04 DIAGNOSIS — L309 Dermatitis, unspecified: Secondary | ICD-10-CM

## 2023-05-05 ENCOUNTER — Telehealth: Payer: Self-pay | Admitting: Physician Assistant

## 2023-05-05 DIAGNOSIS — B360 Pityriasis versicolor: Secondary | ICD-10-CM

## 2023-05-05 MED ORDER — TRIAMCINOLONE ACETONIDE 0.5 % EX OINT: 1.0000 | TOPICAL_OINTMENT | Freq: Two times a day (BID) | CUTANEOUS | 0 refills | Status: DC

## 2023-05-05 MED ORDER — TERBINAFINE HCL 250 MG PO TABS
250.0000 mg | ORAL_TABLET | Freq: Every day | ORAL | 0 refills | Status: DC
Start: 2023-05-05 — End: 2023-08-01

## 2023-05-05 NOTE — Progress Notes (Signed)
Virtual Visit Consent   Stephen Chen, you are scheduled for a virtual visit with a [facility] provider today. Just as with appointments in the office, your consent must be obtained to participate. Your consent will be active for this visit and any virtual visit you may have with one of our providers in the next 365 days. If you have a MyChart account, a copy of this consent can be sent to you electronically.  As this is a virtual visit, video technology does not allow for your provider to perform a traditional examination. This may limit your provider's ability to fully assess your condition. If your provider identifies any concerns that need to be evaluated in person or the need to arrange testing (such as labs, EKG, etc.), we will make arrangements to do so. Although advances in technology are sophisticated, we cannot ensure that it will always work on either your end or our end. If the connection with a video visit is poor, the visit may have to be switched to a telephone visit. With either a video or telephone visit, we are not always able to ensure that we have a secure connection.  By engaging in this virtual visit, you consent to the provision of healthcare and authorize for your insurance to be billed (if applicable) for the services provided during this visit. Depending on your insurance coverage, you may receive a charge related to this service.  I need to obtain your verbal consent now. Are you willing to proceed with your visit today? Stephen Chen has provided verbal consent on 05/05/2023 for a virtual visit (video or telephone). Stephen Chen, [place]  Date: 05/05/2023 6:05 PM  Virtual Visit via Video Note   I, Stephen Chen, connected with  Stephen Chen  ([id number], June 23, 1961) on 05/05/23 at  5:45 PM EST by a video-enabled telemedicine application and verified that I am speaking with the correct person using two identifiers.  Location: Patient: Virtual Visit Location Patient:  Home Provider: Virtual Visit Location Provider: Home Office   I discussed the limitations of evaluation and management by telemedicine and the availability of in person appointments. The patient expressed understanding and agreed to proceed.    History of Present Illness: Stephen Chen is a 62 y.o. who identifies as a male who was assigned male at birth, and is being seen today for concern of recurrence of tinea that he was diagnosed with 2 times previously, once over a year ago and another time in the Spring of this year. Was prescribed terbinafine and topical medication with resolution of symptoms. Notes rash is mildly itchy but not painful. Denies fever, chills. Currently noting on upper arms, back and chest area.   HPI: HPI  Problems: There are no problems to display for this patient.   Allergies: Not on File Medications:  Current Outpatient Medications:    terbinafine (LAMISIL) 250 MG tablet, Take 1 tablet (250 mg total) by mouth daily., Disp: 14 tablet, Rfl: 0   COVID-19 mRNA Vac-TriS, Pfizer, SUSP injection, Inject into the muscle., Disp: 0.3 mL, Rfl: 0   nystatin-triamcinolone ointment (MYCOLOG), Apply 1 Application topically 2 (two) times daily., Disp: 30 g, Rfl: 0   predniSONE (DELTASONE) 20 MG tablet, Take 40 mg by mouth daily for 5 days, then 20mg  by mouth daily for 5 days, then 10mg  daily for 5 days, Disp: 18 tablet, Rfl: 0   triamcinolone ointment (KENALOG) 0.5 %, Apply 1 Application topically 2 (two) times daily., Disp: 60 g, Rfl: 0  Observations/Objective: Patient is well-developed, well-nourished in no acute distress.  Resting comfortably  at home.  Head is normocephalic, atraumatic.  No labored breathing.  Speech is clear and coherent with logical content.  Patient is alert and oriented at baseline.   Unable to visualize rash due to video malfunction during visit.  Assessment and Plan: 1. Tinea versicolor - terbinafine (LAMISIL) 250 MG tablet; Take 1 tablet (250 mg  total) by mouth daily.  Dispense: 14 tablet; Refill: 0  Prior diagnosis. Identical symptoms. Terbinafine per orders. Supportive measures reviewed. In-person follow-up discussed.   Follow Up Instructions: I discussed the assessment and treatment plan with the patient. The patient was provided an opportunity to ask questions and all were answered. The patient agreed with the plan and demonstrated an understanding of the instructions.  A copy of instructions were sent to the patient via MyChart unless otherwise noted below.   The patient was advised to call back or seek an in-person evaluation if the symptoms worsen or if the condition fails to improve as anticipated.    Stephen Climes, PA-C

## 2023-05-05 NOTE — Progress Notes (Signed)
E Visit for Rash  We are sorry that you are not feeling well. Here is how we plan to help!  Based on what you shared with me it looks like you have contact dermatitis.  Contact dermatitis is a skin rash caused by something that touches the skin and causes irritation or inflammation.  Your skin may be red, swollen, dry, cracked, and itch.  The rash should go away in a few days but can last a few weeks.  If you get a rash, it's important to figure out what caused it so the irritant can be avoided in the future.     I have sent in kenalog cream that you will apply twice a day.    HOME CARE:  Take cool showers and avoid direct sunlight. Apply cool compress or wet dressings. Take a bath in an oatmeal bath.  Sprinkle content of one Aveeno packet under running faucet with comfortably warm water.  Bathe for 15-20 minutes, 1-2 times daily.  Pat dry with a towel. Do not rub the rash. Use hydrocortisone cream. Take an antihistamine like Benadryl for widespread rashes that itch.  The adult dose of Benadryl is 25-50 mg by mouth 4 times daily. Caution:  This type of medication may cause sleepiness.  Do not drink alcohol, drive, or operate dangerous machinery while taking antihistamines.  Do not take these medications if you have prostate enlargement.  Read package instructions thoroughly on all medications that you take.  GET HELP RIGHT AWAY IF:  Symptoms don't go away after treatment. Severe itching that persists. If you rash spreads or swells. If you rash begins to smell. If it blisters and opens or develops a yellow-brown crust. You develop a fever. You have a sore throat. You become short of breath.  MAKE SURE YOU:  Understand these instructions. Will watch your condition. Will get help right away if you are not doing well or get worse.  Thank you for choosing an e-visit.  Your e-visit answers were reviewed by a board certified advanced clinical practitioner to complete your personal  care plan. Depending upon the condition, your plan could have included both over the counter or prescription medications.  Please review your pharmacy choice. Make sure the pharmacy is open so you can pick up prescription now. If there is a problem, you may contact your provider through [employer] and have the prescription routed to another pharmacy.  Your safety is important to [place]. If you have drug allergies check your prescription carefully.   For the next 24 hours you can use MyChart to ask questions about today's visit, request a non-urgent call back, or ask for a work or school excuse. You will get an email in the next two days asking about your experience. I hope that your e-visit has been valuable and will speed your recovery.  Approximately 5 minutes was spent documenting and reviewing patient's chart.

## 2023-05-05 NOTE — Patient Instructions (Addendum)
Stephen Chen, thank you for joining Piedad Climes, PA-C for today's virtual visit.  While this provider is not your primary care provider (PCP), if your PCP is located in our provider database this encounter information will be shared with them immediately following your visit.   A [facility] MyChart account gives you access to today's visit and all your visits, tests, and labs performed at [facility] " click here if you don't have a [facility] MyChart account or go to [email or web address]  Consent: (Patient) Stephen Chen provided verbal consent for this virtual visit at the beginning of the encounter.  Current Medications:  Current Outpatient Medications:    terbinafine (LAMISIL) 250 MG tablet, Take 1 tablet (250 mg total) by mouth daily., Disp: 14 tablet, Rfl: 0   COVID-19 mRNA Vac-TriS, Pfizer, SUSP injection, Inject into the muscle., Disp: 0.3 mL, Rfl: 0   nystatin-triamcinolone ointment (MYCOLOG), Apply 1 Application topically 2 (two) times daily., Disp: 30 g, Rfl: 0   predniSONE (DELTASONE) 20 MG tablet, Take 40 mg by mouth daily for 5 days, then 20mg  by mouth daily for 5 days, then 10mg  daily for 5 days, Disp: 18 tablet, Rfl: 0   triamcinolone ointment (KENALOG) 0.5 %, Apply 1 Application topically 2 (two) times daily., Disp: 60 g, Rfl: 0   Medications ordered in this encounter:  Meds ordered this encounter  Medications   terbinafine (LAMISIL) 250 MG tablet    Sig: Take 1 tablet (250 mg total) by mouth daily.    Dispense:  14 tablet    Refill:  0    Order Specific Question:   Supervising Provider    Answer:   Merrilee Jansky [id number]     *If you need refills on other medications prior to your next appointment, please contact your pharmacy*  Follow-Up: Call back or seek an in-person evaluation if the symptoms worsen or if the condition fails to improve as anticipated.  [facility] [phone]  Other Instructions   Tinea  Versicolor  Tinea versicolor is a skin infection. It is caused by a type of yeast. It is normal for some yeast to be on your skin, but too much yeast causes this infection. The infection causes a rash of light or dark patches on your skin. The rash is most common on the chest, back, neck, or upper arms. The infection usually does not cause other problems. If it is treated, it will probably go away in a few weeks. The infection cannot be spread from one person to another (is notcontagious). What are the causes? This condition is caused by a certain type of yeast that starts to grow too much on your skin. What increases the risk? Heat and humidity. Sweating too much. Hormone changes. This may happen when taking birth control pills. Oily skin. A weak disease-fighting system (immunesystem). What are the signs or symptoms? A rash of light or dark patches on your skin. The rash may have: Patches of tan or pink spots (on light skin). Patches of white or brown spots (on dark skin). Patches of skin that do not tan. Well-marked edges. Scales. Mild itching. There may also be no itching. How is this treated? Treatment for this condition may include: Dandruff shampoo. The shampoo may be used on the affected skin during showers or baths. Over-the-counter medicated skin cream, lotion, or soaps. Prescription antifungal medicine. This may include cream or pills. Medicine to help your itching. Follow these instructions at home: Use over-the-counter and prescription  medicines only as told by your doctor. Wash your skin with dandruff shampoo as told by your doctor. Do not scratch your skin in the rash area. Avoid places that are hot and humid. Do not use tanning booths. Try to avoid sweating a lot. Contact a doctor if: Your symptoms get worse. You have a fever. You have signs of infection such as: Redness, swelling, or pain in the rash area. Warmth coming from your rash. Fluid or blood coming from  your rash. Pus or a bad smell coming from your rash. Your rash comes back (recurs) after treatment. Your rash does not improve with treatment. Your rash spreads to other parts of the body. Summary Tinea versicolor is a skin infection. It causes a rash of light or dark patches on your skin. The rash is most common on the chest, back, neck, or upper arms. This infection usually does not cause other problems. Use over-the-counter and prescription medicines only as told by your doctor. If the infection is treated, it will probably go away in a few weeks. This information is not intended to replace advice given to you by your health care provider. Make sure you discuss any questions you have with your health care provider. Document Revised: 09/03/2020 Document Reviewed: 09/03/2020 Elsevier Patient Education  2024 Elsevier Inc.    If you have been instructed to have an in-person evaluation today at a local Urgent Care facility, please use the link below. It will take you to a list of all of our available [facility], including address, phone number and hours of operation. Please do not delay care.  [facility]  If you or a family member do not have a primary care provider, use the link below to schedule a visit and establish care. When you choose a [facility] primary care physician or advanced practice provider, you gain a long-term partner in health. Find a Primary Care Provider  Learn more about [facility]'s in-office and virtual care options: [facility] - Get Care Now

## 2023-08-01 ENCOUNTER — Telehealth: Payer: Self-pay | Admitting: Family Medicine

## 2023-08-01 DIAGNOSIS — B360 Pityriasis versicolor: Secondary | ICD-10-CM

## 2023-08-01 MED ORDER — TERBINAFINE HCL 250 MG PO TABS
250.0000 mg | ORAL_TABLET | Freq: Every day | ORAL | 0 refills | Status: DC
Start: 2023-08-01 — End: 2023-08-10

## 2023-08-01 MED ORDER — TRIAMCINOLONE ACETONIDE 0.1 % EX CREA: 1.0000 | TOPICAL_CREAM | Freq: Two times a day (BID) | CUTANEOUS | 1 refills | Status: DC

## 2023-08-01 MED ORDER — CLOTRIMAZOLE 1 % EX CREA: 1.0000 | TOPICAL_CREAM | Freq: Two times a day (BID) | CUTANEOUS | 1 refills | Status: DC

## 2023-08-01 NOTE — Patient Instructions (Signed)
 Body Ringworm  Body ringworm is an infection of the skin that often causes a ring-shaped rash. Body ringworm is also called tinea corporis.  Body ringworm can affect any part of your skin. This condition is easily spread from person to person (is very contagious).  What are the causes?  This condition is caused by fungi called dermatophytes. The condition develops when these fungi grow out of control on the skin.  You can get this condition if you touch a person or animal that has it. You can also get it if you share any items with an infected person or pet. These include:  Clothing, bedding, and towels.  Brushes or combs.  Gym equipment.  Any other object that has the fungus on it.  What increases the risk?  You are more likely to develop this condition if you:  Play sports that involve close physical contact, such as wrestling.  Sweat a lot.  Live in areas that are hot and humid.  Use public showers.  Have a weakened disease-fighting system (immune system).  What are the signs or symptoms?    Symptoms of this condition include:  Itchy, raised red spots and bumps.  Red scaly patches.  A ring-shaped rash. The rash may have:  A clear center.  Scales or red bumps at its center.  Redness near its borders.  Dry and scaly skin on or around it.  How is this diagnosed?  This condition can usually be diagnosed with a skin exam. A skin scraping may be taken from the affected area and examined under a microscope to see if the fungus is present.  How is this treated?  This condition may be treated with:  An antifungal cream or ointment.  An antifungal shampoo.  Antifungal medicines. These may be prescribed if your ringworm:  Is severe.  Keeps coming back or lasts a long time.  Follow these instructions at home:  Take over-the-counter and prescription medicines only as told by your health care provider.  If you were given an antifungal cream or ointment:  Use it as told by your health care provider.  Wash the infected area and  dry it completely before applying the cream or ointment.  If you were given an antifungal shampoo:  Use it as told by your health care provider.  Leave the shampoo on your body for 3-5 minutes before rinsing.  While you have a rash:  Wear loose clothing to stop clothes from rubbing and irritating it.  Wash or change your bed sheets every night.  Wash clothes and bed sheets in hot water.  Disinfect or throw out items that may be infected.  Wash your hands often with soap and water for at least 20 seconds. If soap and water are not available, use hand sanitizer.  If your pet has the same infection, take your pet to see a veterinarian for treatment.  How is this prevented?  Take a bath or shower every day and after every time you work out or play sports.  Dry your skin completely after bathing.  Wear sandals or shoes in public places and showers.  Wash athletic clothes after each use.  Do not share personal items with others.  Avoid touching red patches of skin on other people.  Avoid touching pets that have bald spots.  If you touch an animal that has a bald spot, wash your hands.  Contact a health care provider if:  Your rash continues to spread after 7 days of  treatment.  Your rash is not gone in 4 weeks.  The area around your rash gets red, warm, tender, and swollen.  This information is not intended to replace advice given to you by your health care provider. Make sure you discuss any questions you have with your health care provider.  Document Revised: 11/27/2021 Document Reviewed: 11/27/2021  Elsevier Patient Education  2024 [employer].

## 2023-08-01 NOTE — Progress Notes (Signed)
Virtual Visit Consent   Stephen Chen, you are scheduled for a virtual visit with a [facility] provider today. Just as with appointments in the office, your consent must be obtained to participate. Your consent will be active for this visit and any virtual visit you may have with one of our providers in the next 365 days. If you have a MyChart account, a copy of this consent can be sent to you electronically.  As this is a virtual visit, video technology does not allow for your provider to perform a traditional examination. This may limit your provider's ability to fully assess your condition. If your provider identifies any concerns that need to be evaluated in person or the need to arrange testing (such as labs, EKG, etc.), we will make arrangements to do so. Although advances in technology are sophisticated, we cannot ensure that it will always work on either your end or our end. If the connection with a video visit is poor, the visit may have to be switched to a telephone visit. With either a video or telephone visit, we are not always able to ensure that we have a secure connection.  By engaging in this virtual visit, you consent to the provision of healthcare and authorize for your insurance to be billed (if applicable) for the services provided during this visit. Depending on your insurance coverage, you may receive a charge related to this service.  I need to obtain your verbal consent now. Are you willing to proceed with your visit today? Stephen Chen has provided verbal consent on 08/01/2023 for a virtual visit (video or telephone). Georgana Curio, FNP  Date: 08/01/2023 10:50 AM  Virtual Visit via Video Note   I, Georgana Curio, connected with  Stephen Chen  ([id number], 1960/12/17) on 08/01/23 at 10:45 AM EST by a video-enabled telemedicine application and verified that I am speaking with the correct person using two identifiers.  Location: Patient: Virtual Visit Location Patient: Home Provider:  Virtual Visit Location Provider: Home Office   I discussed the limitations of evaluation and management by telemedicine and the availability of in person appointments. The patient expressed understanding and agreed to proceed.    History of Present Illness: Stephen Chen is a 63 y.o. who identifies as a male who was assigned male at birth, and is being seen today for tinea rash. He reports rash returned. Marland Kitchen  HPI: HPI  Problems: There are no active problems to display for this patient.   Allergies: Not on File Medications:  Current Outpatient Medications:    COVID-19 mRNA Vac-TriS, Pfizer, SUSP injection, Inject into the muscle., Disp: 0.3 mL, Rfl: 0   nystatin-triamcinolone ointment (MYCOLOG), Apply 1 Application topically 2 (two) times daily., Disp: 30 g, Rfl: 0   predniSONE (DELTASONE) 20 MG tablet, Take 40 mg by mouth daily for 5 days, then 20mg  by mouth daily for 5 days, then 10mg  daily for 5 days, Disp: 18 tablet, Rfl: 0   terbinafine (LAMISIL) 250 MG tablet, Take 1 tablet (250 mg total) by mouth daily., Disp: 14 tablet, Rfl: 0   triamcinolone ointment (KENALOG) 0.5 %, Apply 1 Application topically 2 (two) times daily., Disp: 60 g, Rfl: 0  Observations/Objective: Patient is well-developed, well-nourished in no acute distress.  Resting comfortably  at home.  Head is normocephalic, atraumatic.  No labored breathing.  Speech is clear and coherent with logical content.  Patient is alert and oriented at baseline.    Assessment and Plan: 1. Tinea versicolor  Continue present  meds, PCP as needed.   Follow Up Instructions: I discussed the assessment and treatment plan with the patient. The patient was provided an opportunity to ask questions and all were answered. The patient agreed with the plan and demonstrated an understanding of the instructions.  A copy of instructions were sent to the patient via MyChart unless otherwise noted below.     The patient was advised to call back or  seek an in-person evaluation if the symptoms worsen or if the condition fails to improve as anticipated.    Georgana Curio, FNP

## 2023-08-08 ENCOUNTER — Other Ambulatory Visit (INDEPENDENT_AMBULATORY_CARE_PROVIDER_SITE_OTHER): Payer: Self-pay | Admitting: Physician Assistant

## 2023-08-08 DIAGNOSIS — L2084 Intrinsic (allergic) eczema: Secondary | ICD-10-CM

## 2023-08-10 ENCOUNTER — Telehealth: Payer: Self-pay | Admitting: Physician Assistant

## 2023-08-10 DIAGNOSIS — B360 Pityriasis versicolor: Secondary | ICD-10-CM

## 2023-08-10 MED ORDER — CLOTRIMAZOLE-BETAMETHASONE 1-0.05 % EX CREA: 1.0000 | TOPICAL_CREAM | Freq: Every day | CUTANEOUS | 0 refills | Status: DC

## 2023-08-10 MED ORDER — TERBINAFINE HCL 250 MG PO TABS: 250.0000 mg | ORAL_TABLET | Freq: Every day | ORAL | 0 refills | Status: DC

## 2023-08-10 NOTE — Patient Instructions (Addendum)
  [name], thank you for joining Tylene Fantasia Ward, PA-C for today's virtual visit.  While this provider is not your primary care provider (PCP), if your PCP is located in our provider database this encounter information will be shared with them immediately following your visit.   A [facility] MyChart account gives you access to today's visit and all your visits, tests, and labs performed at [facility] " click here if you don't have a [facility] MyChart account or go to [email or web address]  Consent: (Patient) [name] provided verbal consent for this virtual visit at the beginning of the encounter.  Current Medications:  Current Outpatient Medications:    clotrimazole (LOTRIMIN) 1 % cream, Apply 1 Application topically 2 (two) times daily., Disp: 60 g, Rfl: 1   COVID-19 mRNA Vac-TriS, Pfizer, SUSP injection, Inject into the muscle., Disp: 0.3 mL, Rfl: 0   nystatin-triamcinolone ointment (MYCOLOG), Apply 1 Application topically 2 (two) times daily., Disp: 30 g, Rfl: 0   predniSONE (DELTASONE) 20 MG tablet, Take 40 mg by mouth daily for 5 days, then 20mg  by mouth daily for 5 days, then 10mg  daily for 5 days, Disp: 18 tablet, Rfl: 0   triamcinolone cream (KENALOG) 0.1 %, Apply 1 Application topically 2 (two) times daily., Disp: 60 g, Rfl: 1   triamcinolone ointment (KENALOG) 0.5 %, Apply 1 Application topically 2 (two) times daily., Disp: 60 g, Rfl: 0   Medications ordered in this encounter:  No orders of the defined types were placed in this encounter.    *If you need refills on other medications prior to your next appointment, please contact your pharmacy*  Follow-Up: Call back or seek an in-person evaluation if the symptoms worsen or if the condition fails to improve as anticipated.  [facility] [phone]  Other Instructions  Continue terbinafine, prescription provided.  Recommended in person follow up.   These clinics are accepting new  patients:   [facility] Primary Care at [facility] [address] [place] [address]  [facility] Primary Care at [facility] [address], [address] [place] [address]  You can use MyChart to make the appointment.   [email or web address]  If you have been instructed to have an in-person evaluation today at a local Urgent Care facility, please use the link below. It will take you to a list of all of our available [facility], including address, phone number and hours of operation. Please do not delay care.  [facility]  If you or a family member do not have a primary care provider, use the link below to schedule a visit and establish care. When you choose a [facility] primary care physician or advanced practice provider, you gain a long-term partner in health. Find a Primary Care Provider  Learn more about [facility]'s in-office and virtual care options:  - Get Care Now

## 2023-08-10 NOTE — Progress Notes (Signed)
Virtual Visit Consent   [facility], you are scheduled for a virtual visit with a [facility] provider today. Just as with appointments in the office, your consent must be obtained to participate. Your consent will be active for this visit and any virtual visit you may have with one of our providers in the next 365 days. If you have a MyChart account, a copy of this consent can be sent to you electronically.  As this is a virtual visit, video technology does not allow for your provider to perform a traditional examination. This may limit your provider's ability to fully assess your condition. If your provider identifies any concerns that need to be evaluated in person or the need to arrange testing (such as labs, EKG, etc.), we will make arrangements to do so. Although advances in technology are sophisticated, we cannot ensure that it will always work on either your end or our end. If the connection with a video visit is poor, the visit may have to be switched to a telephone visit. With either a video or telephone visit, we are not always able to ensure that we have a secure connection.  By engaging in this virtual visit, you consent to the provision of healthcare and authorize for your insurance to be billed (if applicable) for the services provided during this visit. Depending on your insurance coverage, you may receive a charge related to this service.  I need to obtain your verbal consent now. Are you willing to proceed with your visit today? Stephen Chen has provided verbal consent on 08/10/2023 for a virtual visit (video or telephone). Tylene Fantasia Ward, PA-C  Date: 08/10/2023 5:55 PM   Virtual Visit via Video Note   I, Tylene Fantasia Ward, connected with  Stephen Chen  ([id number], February 28, 1961) on 08/10/23 at  5:00 PM EST by a video-enabled telemedicine application and verified that I am speaking with the correct person using two identifiers.  Location: Patient: Virtual Visit Location Patient:  Home Provider: Virtual Visit Location Provider: Home Office   I discussed the limitations of evaluation and management by telemedicine and the availability of in person appointments. The patient expressed understanding and agreed to proceed.    History of Present Illness: Stephen Chen is a 63 y.o. who identifies as a male who was assigned male at birth, and is being seen today for continued tinea rash.  Reports he was prescribed terbinafine and states it is helping, but he only has two days left and the rash is not completely resolved.  HPI: HPI  Problems: There are no active problems to display for this patient.   Allergies: Not on File Medications:  Current Outpatient Medications:    terbinafine (LAMISIL) 250 MG tablet, Take 1 tablet (250 mg total) by mouth daily for 14 days., Disp: 14 tablet, Rfl: 0   clotrimazole (LOTRIMIN) 1 % cream, Apply 1 Application topically 2 (two) times daily., Disp: 60 g, Rfl: 1   COVID-19 mRNA Vac-TriS, Pfizer, SUSP injection, Inject into the muscle., Disp: 0.3 mL, Rfl: 0   nystatin-triamcinolone ointment (MYCOLOG), Apply 1 Application topically 2 (two) times daily., Disp: 30 g, Rfl: 0   predniSONE (DELTASONE) 20 MG tablet, Take 40 mg by mouth daily for 5 days, then 20mg  by mouth daily for 5 days, then 10mg  daily for 5 days, Disp: 18 tablet, Rfl: 0   triamcinolone cream (KENALOG) 0.1 %, Apply 1 Application topically 2 (two) times daily., Disp: 60 g, Rfl: 1   triamcinolone ointment (KENALOG) 0.5 %,  Apply 1 Application topically 2 (two) times daily., Disp: 60 g, Rfl: 0  Observations/Objective: Patient is well-developed, well-nourished in no acute distress.  Resting comfortably at home.  Head is normocephalic, atraumatic.  No labored breathing.  Speech is clear and coherent with logical content.  Patient is alert and oriented at baseline.    Assessment and Plan: 1. Tinea versicolor (Primary)  Will refill terbinafine and advised in person evaluation due to  chronicity of sx.  PCP information given.   Follow Up Instructions: I discussed the assessment and treatment plan with the patient. The patient was provided an opportunity to ask questions and all were answered. The patient agreed with the plan and demonstrated an understanding of the instructions.  A copy of instructions were sent to the patient via MyChart unless otherwise noted below.     The patient was advised to call back or seek an in-person evaluation if the symptoms worsen or if the condition fails to improve as anticipated.    Tylene Fantasia Ward, PA-C

## 2023-08-24 ENCOUNTER — Telehealth: Payer: Self-pay

## 2023-08-24 DIAGNOSIS — B354 Tinea corporis: Secondary | ICD-10-CM

## 2023-08-24 MED ORDER — CLOTRIMAZOLE-BETAMETHASONE 1-0.05 % EX CREA: 1.0000 | TOPICAL_CREAM | Freq: Every day | CUTANEOUS | 0 refills | Status: DC

## 2023-08-24 MED ORDER — TERBINAFINE HCL 250 MG PO TABS: 250.0000 mg | ORAL_TABLET | Freq: Every day | ORAL | 0 refills | Status: AC

## 2023-08-24 NOTE — Patient Instructions (Signed)
 Stephen Chen, thank you for joining Stephen Loveless, PA-C for today's virtual visit.  While this provider is not your primary care provider (PCP), if your PCP is located in our provider database this encounter information will be shared with them immediately following your visit.   A [facility] MyChart account gives you access to today's visit and all your visits, tests, and labs performed at [facility] " click here if you don't have a [name] MyChart account or go to [email or web address]  Consent: (Patient) Stephen Chen provided verbal consent for this virtual visit at the beginning of the encounter.  Current Medications:  Current Outpatient Medications:    clotrimazole-betamethasone (LOTRISONE) cream, Apply 1 Application topically daily., Disp: 30 g, Rfl: 0   COVID-19 mRNA Vac-TriS, Pfizer, SUSP injection, Inject into the muscle., Disp: 0.3 mL, Rfl: 0   predniSONE (DELTASONE) 20 MG tablet, Take 40 mg by mouth daily for 5 days, then 20mg  by mouth daily for 5 days, then 10mg  daily for 5 days, Disp: 18 tablet, Rfl: 0   terbinafine (LAMISIL) 250 MG tablet, Take 1 tablet (250 mg total) by mouth daily for 14 days., Disp: 14 tablet, Rfl: 0   Medications ordered in this encounter:  Meds ordered this encounter  Medications   terbinafine (LAMISIL) 250 MG tablet    Sig: Take 1 tablet (250 mg total) by mouth daily for 14 days.    Dispense:  14 tablet    Refill:  0    Supervising Provider:   Merrilee Chen [[id number]]   clotrimazole-betamethasone (LOTRISONE) cream    Sig: Apply 1 Application topically daily.    Dispense:  30 g    Refill:  0    Supervising Provider:   Merrilee Chen [[id number]]     *If you need refills on other medications prior to your next appointment, please contact your pharmacy*  Follow-Up: Call back or seek an in-person evaluation if the symptoms worsen or if the condition fails to improve as anticipated.  [facility] [phone]  Other Instructions  Make sure to establish with a Primary Care office as we can no longer treat you for this rash since you have not been evaluated in person and rash has been recurrent for over a year requiring multiple treatments per year.  Body Ringworm Body ringworm is an infection of the skin that often causes a ring-shaped rash. Body ringworm is also called tinea corporis. Body ringworm can affect any part of your skin. This condition is easily spread from person to person (is very contagious). What are the causes? This condition is caused by fungi called dermatophytes. The condition develops when these fungi grow out of control on the skin. You can get this condition if you touch a person or animal that has it. You can also get it if you share any items with an infected person or pet. These include: Clothing, bedding, and towels. Brushes or combs. Gym equipment. Any other object that has the fungus on it. What increases the risk? You are more likely to develop this condition if you: Play sports that involve close physical contact, such as wrestling. Sweat a lot. Live in areas that are hot and humid. Use public showers. Have a weakened disease-fighting system (immune system). What are the signs or symptoms? Symptoms of this condition include: Itchy, raised red spots and bumps. Red scaly patches. A ring-shaped rash. The rash may have: A clear center. Scales or red bumps at its center.  Redness near its borders. Dry and scaly skin on or around it. How is this diagnosed? This condition can usually be diagnosed with a skin exam. A skin scraping may be taken from the affected area and examined under a microscope to see if the fungus is present. How is this treated? This condition may be treated with: An antifungal cream or ointment. An antifungal shampoo. Antifungal medicines. These may be prescribed if your ringworm: Is severe. Keeps coming back or lasts a long  time. Follow these instructions at home: Take over-the-counter and prescription medicines only as told by your health care provider. If you were given an antifungal cream or ointment: Use it as told by your health care provider. Wash the infected area and dry it completely before applying the cream or ointment. If you were given an antifungal shampoo: Use it as told by your health care provider. Leave the shampoo on your body for 3-5 minutes before rinsing. While you have a rash: Wear loose clothing to stop clothes from rubbing and irritating it. Wash or change your bed sheets every night. Wash clothes and bed sheets in hot water. Disinfect or throw out items that may be infected. Wash your hands often with soap and water for at least 20 seconds. If soap and water are not available, use hand sanitizer. If your pet has the same infection, take your pet to see a veterinarian for treatment. How is this prevented? Take a bath or shower every day and after every time you work out or play sports. Dry your skin completely after bathing. Wear sandals or shoes in public places and showers. Wash athletic clothes after each use. Do not share personal items with others. Avoid touching red patches of skin on other people. Avoid touching pets that have bald spots. If you touch an animal that has a bald spot, wash your hands. Contact a health care provider if: Your rash continues to spread after 7 days of treatment. Your rash is not gone in 4 weeks. The area around your rash gets red, warm, tender, and swollen. This information is not intended to replace advice given to you by your health care provider. Make sure you discuss any questions you have with your health care provider. Document Revised: 11/27/2021 Document Reviewed: 11/27/2021 Elsevier Patient Education  2024 Elsevier Inc.   If you have been instructed to have an in-person evaluation today at a local Urgent Care facility, please use the  link below. It will take you to a list of all of our available [facility], including address, phone number and hours of operation. Please do not delay care.  [facility]  If you or a family member do not have a primary care provider, use the link below to schedule a visit and establish care. When you choose a [facility] primary care physician or advanced practice provider, you gain a long-term partner in health. Find a Primary Care Provider  Learn more about [facility]'s in-office and virtual care options: [facility] - Get Care Now

## 2023-08-24 NOTE — Progress Notes (Signed)
 Virtual Visit Consent   Stephen Chen, you are scheduled for a virtual visit with a [facility] provider today. Just as with appointments in the office, your consent must be obtained to participate. Your consent will be active for this visit and any virtual visit you may have with one of our providers in the next 365 days. If you have a MyChart account, a copy of this consent can be sent to you electronically.  As this is a virtual visit, video technology does not allow for your provider to perform a traditional examination. This may limit your provider's ability to fully assess your condition. If your provider identifies any concerns that need to be evaluated in person or the need to arrange testing (such as labs, EKG, etc.), we will make arrangements to do so. Although advances in technology are sophisticated, we cannot ensure that it will always work on either your end or our end. If the connection with a video visit is poor, the visit may have to be switched to a telephone visit. With either a video or telephone visit, we are not always able to ensure that we have a secure connection.  By engaging in this virtual visit, you consent to the provision of healthcare and authorize for your insurance to be billed (if applicable) for the services provided during this visit. Depending on your insurance coverage, you may receive a charge related to this service.  I need to obtain your verbal consent now. Are you willing to proceed with your visit today? Stephen Chen has provided verbal consent on 08/24/2023 for a virtual visit (video or telephone). [name], PA-C  Date: 08/24/2023 9:04 AM   Virtual Visit via Video Note   I, [name], connected with  Stephen Chen  ([id number], [birth year]) on 08/24/23 at  9:00 AM EST by a video-enabled telemedicine application and verified that I am speaking with the correct person using two identifiers.  Location: Patient: Virtual Visit Location Patient:  Home Provider: Virtual Visit Location Provider: Home Office   I discussed the limitations of evaluation and management by telemedicine and the availability of in person appointments. The patient expressed understanding and agreed to proceed.    History of Present Illness: Stephen Chen is a 63 y.o. who identifies as a male who was assigned male at birth, and is being seen today for continued ringworm. This has been a long-term, recurrent issue. Initially treated for it on 10/13/22 with Fluconazole weekly x 4 weeks. Then seen again on 11/10/22 for same issue and given Terbinafine x 14 days and Mycolog ointment.   He did have episodes prior in March and Nov 2023 but this was felt to have been more eczematous and was treated with Prednisone and Triamcinolone cream respectively.   Then there was another recurrence in Nov 2024, for which he was seen virtually again (E-Visit) and was given Triamcinolone 0.5% cream for possible dermatitis. Symptoms worsened so he completed a video visit the next day, 05/05/23, and was diagnosed with tinea and given Terbinafine again for 14 days.   The most recent outbreak started on 08/01/23 where he was seen by video and given Terbinafine tablets, Clotrimazole 1% cream and Triamcinolone 0.1% cream.  Seen again by video on 08/10/23, stating has 2 days of Terbinafine left and rash was improving but not completely resolved. Was given another round of Terbinafine 250mg  tablets x 14 days and Clotrimazole-betamethasone cream.   Today, he reports symptoms are almost completely resolved, but still present. He  is requesting one more refill to hopefully eradicate rash. Reports this was the worst it had been.    Problems: There are no active problems to display for this patient.   Allergies: Not on File Medications:  Current Outpatient Medications:    clotrimazole-betamethasone (LOTRISONE) cream, Apply 1 Application topically daily., Disp: 30 g, Rfl: 0   COVID-19 mRNA Vac-TriS,  Pfizer, SUSP injection, Inject into the muscle., Disp: 0.3 mL, Rfl: 0   predniSONE (DELTASONE) 20 MG tablet, Take 40 mg by mouth daily for 5 days, then 20mg  by mouth daily for 5 days, then 10mg  daily for 5 days, Disp: 18 tablet, Rfl: 0   terbinafine (LAMISIL) 250 MG tablet, Take 1 tablet (250 mg total) by mouth daily for 14 days., Disp: 14 tablet, Rfl: 0  Observations/Objective: Patient is well-developed, well-nourished in no acute distress.  Resting comfortably at home.  Head is normocephalic, atraumatic.  No labored breathing.  Speech is clear and coherent with logical content.  Patient is alert and oriented at baseline.    Assessment and Plan: 1. Tinea corporis (Primary) - terbinafine (LAMISIL) 250 MG tablet; Take 1 tablet (250 mg total) by mouth daily for 14 days.  Dispense: 14 tablet; Refill: 0 - clotrimazole-betamethasone (LOTRISONE) cream; Apply 1 Application topically daily.  Dispense: 30 g; Refill: 0  - Refilled Terbinafine and Lotrisone cream as above - Advised patient this will be the last refill of these medications through the Virtual Urgent Care department as terbinafine requires lab monitoring of his liver enzymes - Discussed link in AVS for finding and establishing with a PCP as this is an issue we will no longer treat since he has not had any in person evaluation for this rash in over 2 years. - Discussed potential need to be seen by Dermatology since rash has essentially been chronic and requiring multiple treatments per year  Follow Up Instructions: I discussed the assessment and treatment plan with the patient. The patient was provided an opportunity to ask questions and all were answered. The patient agreed with the plan and demonstrated an understanding of the instructions.  A copy of instructions were sent to the patient via MyChart unless otherwise noted below.    The patient was advised to call back or seek an in-person evaluation if the symptoms worsen or if the  condition fails to improve as anticipated.    [name], PA-C

## 2023-12-15 ENCOUNTER — Ambulatory Visit (INDEPENDENT_AMBULATORY_CARE_PROVIDER_SITE_OTHER): Payer: Self-pay | Admitting: Family Medicine

## 2023-12-15 ENCOUNTER — Encounter: Payer: Self-pay | Admitting: Family Medicine

## 2023-12-15 VITALS — BP 136/86 | HR 80 | Temp 98.1°F | Ht 70.0 in | Wt 132.2 lb

## 2023-12-15 DIAGNOSIS — Z7689 Persons encountering health services in other specified circumstances: Secondary | ICD-10-CM | POA: Diagnosis not present

## 2023-12-15 DIAGNOSIS — Z0000 Encounter for general adult medical examination without abnormal findings: Secondary | ICD-10-CM

## 2023-12-15 DIAGNOSIS — Z1329 Encounter for screening for other suspected endocrine disorder: Secondary | ICD-10-CM

## 2023-12-15 DIAGNOSIS — Z136 Encounter for screening for cardiovascular disorders: Secondary | ICD-10-CM

## 2023-12-15 DIAGNOSIS — Z1159 Encounter for screening for other viral diseases: Secondary | ICD-10-CM | POA: Insufficient documentation

## 2023-12-15 DIAGNOSIS — Z13220 Encounter for screening for lipoid disorders: Secondary | ICD-10-CM

## 2023-12-15 DIAGNOSIS — Z13228 Encounter for screening for other metabolic disorders: Secondary | ICD-10-CM

## 2023-12-15 NOTE — Progress Notes (Signed)
 Complete physical exam  Patient: Stephen Chen   DOB: 08-Jan-1961   63 y.o. Male  MRN: [id number]  Subjective:    Chief Complaint  Patient presents with   Establish Care    Stephen Chen is a 63 y.o. male who presents today for a complete physical exam. He reports consuming a healthy diet. Golfing and outside work  He generally feels well. He reports sleeping well. He does not have additional problems to discuss today.    Most recent fall risk assessment:    12/15/2023    9:39 AM  Fall Risk   Falls in the past year? 0  Number falls in past yr: 0  Injury with Fall? 0  Risk for fall due to : No Fall Risks  Follow up Falls evaluation completed     Most recent depression screenings:    12/15/2023    9:40 AM  PHQ 2/9 Scores  PHQ - 2 Score 0  PHQ- 9 Score 0    Vision:  within last year  and Dental: No current dental problems and Receives regular dental care    Patient Care Team: Mickiel Albany, FNP as PCP - General (Family Medicine)   Outpatient Medications Prior to Visit  Medication Sig   clotrimazole -betamethasone  (LOTRISONE ) cream Apply 1 Application topically daily. (Patient not taking: Reported on 12/15/2023)   COVID-19 mRNA Vac-TriS, Pfizer, SUSP injection Inject into the muscle. (Patient not taking: Reported on 12/15/2023)   predniSONE  (DELTASONE ) 20 MG tablet Take 40 mg by mouth daily for 5 days, then 20mg  by mouth daily for 5 days, then 10mg  daily for 5 days (Patient not taking: Reported on 12/15/2023)   No facility-administered medications prior to visit.    ROS        Objective:     BP 136/86 (Cuff Size: Normal)   Pulse 80   Temp 98.1 F (36.7 C) (Oral)   Ht 5' 10 (1.778 m)   Wt 132 lb 3.2 oz (60 kg)   SpO2 98%   BMI 18.97 kg/m    Physical Exam Vitals and nursing note reviewed.  Constitutional:      General: He is not in acute distress.    Appearance: Normal appearance.  HENT:     Right Ear: Tympanic membrane normal.     Left Ear: Tympanic  membrane normal.     Nose: Nose normal.     Mouth/Throat:     Mouth: Mucous membranes are moist.     Pharynx: Oropharynx is clear.   Eyes:     Extraocular Movements: Extraocular movements intact.   Neck:     Thyroid: No thyroid tenderness.   Cardiovascular:     Rate and Rhythm: Normal rate and regular rhythm.     Pulses:          Radial pulses are 2+ on the right side and 2+ on the left side.     Heart sounds: Normal heart sounds, S1 normal and S2 normal.  Pulmonary:     Effort: Pulmonary effort is normal.     Breath sounds: Normal breath sounds.  Abdominal:     General: Bowel sounds are normal.     Palpations: Abdomen is soft.     Tenderness: There is no abdominal tenderness.   Musculoskeletal:        General: Normal range of motion.     Cervical back: Normal range of motion.     Right lower leg: No edema.     Left lower  leg: No edema.  Lymphadenopathy:     Cervical:     Right cervical: No superficial cervical adenopathy.    Left cervical: No superficial cervical adenopathy.   Skin:    General: Skin is warm and dry.   Neurological:     General: No focal deficit present.     Mental Status: He is alert. Mental status is at baseline.   Psychiatric:        Mood and Affect: Mood normal.        Behavior: Behavior normal.        Thought Content: Thought content normal.        Judgment: Judgment normal.      No results found for any visits on 12/15/23.     Assessment & Plan:    Routine Health Maintenance and Physical Exam  Immunization History  Administered Date(s) Administered   PFIZER Comirnaty Martina Sledge Top)Covid-19 Tri-Sucrose Vaccine 01/14/2021   PFIZER(Purple Top)SARS-COV-2 Vaccination 06/04/2020    Health Maintenance  Topic Date Due   COVID-19 Vaccine (3 - 2024-25 season) 12/31/2023 (Originally 02/28/2023)   Zoster Vaccines- Shingrix (1 of 2) 03/16/2024 (Originally 04/15/2013)   DTaP/Tdap/Td (1 - Tdap) 12/14/2024 (Originally 04/15/1982)   Pneumococcal  Vaccine 61-67 Years old (1 of 2 - PCV) 12/14/2024 (Originally 04/15/1982)   Hepatitis C Screening  12/14/2024 (Originally 04/15/1981)   HIV Screening  12/14/2024 (Originally 04/15/1978)   INFLUENZA VACCINE  01/28/2024   Colonoscopy  08/05/2026   HPV VACCINES  Aged Out   Meningococcal B Vaccine  Aged Out   Physically active.   Annual physical exam  Establishing care with new doctor, encounter for -     CBC -     Comprehensive metabolic panel with GFR  Encounter for lipid screening for cardiovascular disease -     Lipid panel  Encounter for screening for metabolic disorder -     Comprehensive metabolic panel with GFR -     Hemoglobin A1c  Screening for viral disease  Screening for thyroid disorder -     TSH + free T4      Routine labs ordered.  HCM reviewed/discussed.Declines Hep C and HIV today. No vaccines.  Anticipatory guidance regarding healthy weight, lifestyle and choices given. Recommend healthy diet.  Recommend approximately 150 minutes/week of moderate intensity exercise. Resistance training is good for building muscles and for bone health. Muscle mass helps to increase our metabolism and to burn more calories at rest.  Limit alcohol consumption: no more than one drink per day for women and 2 drinks per day for me. Recommend regular dental and vision exams. Always use seatbelt/lap and shoulder restraints. Recommend using smoke alarms and checking batteries at least twice a year. Recommend using sunscreen when outside.  Please know that I am here to help you with all of your health care goals and am happy to work with you to find a solution that works best for you.  The greatest advice I have received with any changes in life are to take it one step at a time, that even means if all you can focus on is the next 60 seconds, then do that and celebrate your victories.  With any changes in life, you will have set backs, and that is OK. The important thing to remember is,  if you have a set back, it is not a failure, it is an opportunity to try again! Agrees with plan of care discussed.  Questions answered.      Return in about  1 year (around 12/15/2024) for CPE with labs.     Mickiel Albany, FNP

## 2023-12-16 ENCOUNTER — Ambulatory Visit: Payer: Self-pay | Admitting: Family Medicine

## 2023-12-16 LAB — COMPREHENSIVE METABOLIC PANEL WITH GFR
ALT: 21 IU/L (ref 0–44)
AST: 28 IU/L (ref 0–40)
Albumin: 4.7 g/dL (ref 3.8–4.9)
Alkaline Phosphatase: 99 IU/L (ref 44–121)
BUN/Creatinine Ratio: 17 (ref 10–24)
BUN: 15 mg/dL (ref 8–27)
Bilirubin Total: 0.6 mg/dL (ref 0.0–1.2)
CO2: 23 mmol/L (ref 20–29)
Calcium: 9.6 mg/dL (ref 8.6–10.2)
Chloride: 100 mmol/L (ref 96–106)
Creatinine, Ser: 0.9 mg/dL (ref 0.76–1.27)
Globulin, Total: 2.5 g/dL (ref 1.5–4.5)
Glucose: 111 mg/dL — ABNORMAL HIGH (ref 70–99)
Potassium: 4.2 mmol/L (ref 3.5–5.2)
Sodium: 140 mmol/L (ref 134–144)
Total Protein: 7.2 g/dL (ref 6.0–8.5)
eGFR: 98 mL/min/{1.73_m2} (ref >59)

## 2023-12-16 LAB — CBC
Hematocrit: 45.6 % (ref 37.5–51.0)
Hemoglobin: 15.9 g/dL (ref 13.0–17.7)
MCH: 33.9 pg — ABNORMAL HIGH (ref 26.6–33.0)
MCHC: 34.9 g/dL (ref 31.5–35.7)
MCV: 97 fL (ref 79–97)
Platelets: 211 10*3/uL (ref 150–450)
RBC: 4.69 x10E6/uL (ref 4.14–5.80)
RDW: 11.6 % (ref 11.6–15.4)
WBC: 6.8 10*3/uL (ref 3.4–10.8)

## 2023-12-16 LAB — LIPID PANEL
Chol/HDL Ratio: 2.5 ratio (ref 0.0–5.0)
Cholesterol, Total: 192 mg/dL (ref 100–199)
HDL: 76 mg/dL (ref >39)
LDL Chol Calc (NIH): 106 mg/dL — ABNORMAL HIGH (ref 0–99)
Triglycerides: 51 mg/dL (ref 0–149)
VLDL Cholesterol Cal: 10 mg/dL (ref 5–40)

## 2023-12-16 LAB — HEMOGLOBIN A1C
Est. average glucose Bld gHb Est-mCnc: 105 mg/dL
Hgb A1c MFr Bld: 5.3 % (ref 4.8–5.6)

## 2023-12-16 LAB — TSH+FREE T4
Free T4: 1.43 ng/dL (ref 0.82–1.77)
TSH: 1.21 u[IU]/mL (ref 0.450–4.500)

## 2024-06-02 ENCOUNTER — Encounter: Payer: Self-pay | Admitting: Family Medicine

## 2024-06-02 ENCOUNTER — Ambulatory Visit: Admitting: Family Medicine

## 2024-06-02 VITALS — BP 130/80 | HR 88 | Ht 65.0 in | Wt 136.0 lb

## 2024-06-02 DIAGNOSIS — B354 Tinea corporis: Secondary | ICD-10-CM | POA: Diagnosis not present

## 2024-06-02 MED ORDER — TERBINAFINE HCL 250 MG PO TABS: 250.0000 mg | ORAL_TABLET | Freq: Every day | ORAL | 0 refills | Status: DC

## 2024-06-02 MED ORDER — CLOTRIMAZOLE-BETAMETHASONE 1-0.05 % EX CREA: 1.0000 | TOPICAL_CREAM | Freq: Every day | CUTANEOUS | 1 refills | Status: DC

## 2024-06-02 NOTE — Assessment & Plan Note (Signed)
 Symptoms are consistent with ringworm. Terbinafine  250 mg daily for 14 days. Clotrimazole -betamethasone  topical cream daily. Recommend avoiding cats. Follow-up is symptoms do not resolve. May benefit from derm.

## 2024-06-02 NOTE — Progress Notes (Signed)
   Acute Office Visit  Subjective:     Patient ID: Stephen Chen, male    DOB: 09/15/1960, 63 y.o.   MRN: [id number]  Chief Complaint  Patient presents with   Rash    He states he has ringworm on both his lower legs and has been using OTC cream. He has been treated for this in the past and was given a oral agent and steroid     HPI Patient is in today for ringworm infection.  This is recurring. Currently present on left ankle, forehead, low back and upper back. Has had 2 virtual visit for this earlier this year. Treated with oral medication and topical antifungal/steroid cream. Has cats.  Advised to avoid cats if possible.    ROS      Objective:    BP 130/80 (Patient Position: Sitting, Cuff Size: Normal)   Pulse 88   Ht 5' 5 (1.651 m)   Wt 136 lb (61.7 kg)   SpO2 94%   BMI 22.63 kg/m    Physical Exam Vitals and nursing note reviewed.  Constitutional:      General: He is not in acute distress.    Appearance: Normal appearance. He is normal weight.  Pulmonary:     Effort: Pulmonary effort is normal.  Skin:    General: Skin is warm and dry.     Comments: Erytheamtous circular patches on low back, left ankle, and upper back.   Neurological:     General: No focal deficit present.     Mental Status: He is alert. Mental status is at baseline.  Psychiatric:        Mood and Affect: Mood normal.        Behavior: Behavior normal.        Thought Content: Thought content normal.        Judgment: Judgment normal.     No results found for any visits on 06/02/24.      Assessment & Plan:   Problem List Items Addressed This Visit     Tinea corporis - Primary   Symptoms are consistent with ringworm. Terbinafine  250 mg daily for 14 days. Clotrimazole -betamethasone  topical cream daily. Recommend avoiding cats. Follow-up is symptoms do not resolve. May benefit from derm.      Relevant Medications   terbinafine  (LAMISIL ) 250 MG tablet   clotrimazole -betamethasone   (LOTRISONE ) cream    Meds ordered this encounter  Medications   terbinafine  (LAMISIL ) 250 MG tablet    Sig: Take 1 tablet (250 mg total) by mouth daily.    Dispense:  14 tablet    Refill:  0    Supervising Provider:   METHENEY, CATHERINE D [[id number]]   clotrimazole -betamethasone  (LOTRISONE ) cream    Sig: Apply 1 Application topically daily.    Dispense:  30 g    Refill:  1    Supervising Provider:   METHENEY, CATHERINE D [[id number]]  Agrees with plan of care discussed.  Questions answered.   Return if symptoms worsen or fail to improve.  Darice JONELLE Brownie, FNP

## 2024-07-04 ENCOUNTER — Ambulatory Visit (INDEPENDENT_AMBULATORY_CARE_PROVIDER_SITE_OTHER): Admitting: Family Medicine

## 2024-07-04 ENCOUNTER — Encounter: Payer: Self-pay | Admitting: Family Medicine

## 2024-07-04 VITALS — BP 148/87 | HR 70 | Temp 98.0°F | Ht 65.0 in | Wt 140.0 lb

## 2024-07-04 DIAGNOSIS — B354 Tinea corporis: Secondary | ICD-10-CM

## 2024-07-04 NOTE — Assessment & Plan Note (Signed)
 Prescribed terbinafine  250 mg daily for 14 days and  Clotrimazole -betamethasone  cream daily on 06/02/24.  Symptoms are improved some with treatment. As soon as he stops taking the symptoms return.  This is a recurring problem. Discussed repeating the terbinafine  treatment, however, hepatic function needs to be checked.  Unable to get labs today, feels like he will faint with blood draw today. Will defer to derm for evaluation.

## 2024-07-04 NOTE — Progress Notes (Signed)
" ° °  Established Patient Office Visit  Subjective   Patient ID: Stephen Chen, male    DOB: 1961/06/29  Age: 64 y.o. MRN: [id number]  Chief Complaint  Patient presents with   Recurrent Skin Infections    Follow up for ringworm     HPI  06/02/24: recurring ringworm: terbinafine  250 mg daily for 14 days Clotrimazole -betamethasone  cream daily Symptoms are improved some with treatment. As soon as he stops taking the symptoms return.  This is a recurring problem. Unable to get labs today, feels like he will faint with blood draw today. Will defer to derm for evaluation.    ROS    Objective:     BP (!) 148/87 (BP Location: Left Arm, Patient Position: Sitting, Cuff Size: Large)   Pulse 70   Temp 98 F (36.7 C) (Oral)   Ht 5' 5 (1.651 m)   Wt 140 lb (63.5 kg)   SpO2 98%   BMI 23.30 kg/m    Physical Exam   No results found for any visits on 07/04/24.    The ASCVD Risk score (Arnett DK, et al., 2019) failed to calculate for the following reasons:   Unable to determine if patient is Non-Hispanic African American    Assessment & Plan:   Problem List Items Addressed This Visit     Tinea corporis - Primary   Prescribed terbinafine  250 mg daily for 14 days and  Clotrimazole -betamethasone  cream daily on 06/02/24.  Symptoms are improved some with treatment. As soon as he stops taking the symptoms return.  This is a recurring problem. Discussed repeating the terbinafine  treatment, however, hepatic function needs to be checked.  Unable to get labs today, feels like he will faint with blood draw today. Will defer to derm for evaluation.        Relevant Orders   Ambulatory referral to Dermatology  Agrees with plan of care discussed.  Questions answered.   Return if symptoms worsen or fail to improve.    Darice JONELLE Brownie, FNP  "

## 2024-07-31 ENCOUNTER — Other Ambulatory Visit: Payer: Self-pay | Admitting: Family Medicine

## 2024-07-31 DIAGNOSIS — B354 Tinea corporis: Secondary | ICD-10-CM
